# Patient Record
Sex: Female | Born: 1980 | Race: Black or African American | Hispanic: No | Marital: Single | Smoking: Never smoker
Health system: Southern US, Community
[De-identification: ages and names within clinical notes are randomized; demographics above are authoritative.]

## PROBLEM LIST (undated history)

## (undated) DIAGNOSIS — E282 Polycystic ovarian syndrome: Secondary | ICD-10-CM

## (undated) DIAGNOSIS — G4730 Sleep apnea, unspecified: Secondary | ICD-10-CM

## (undated) DIAGNOSIS — J45909 Unspecified asthma, uncomplicated: Secondary | ICD-10-CM

## (undated) HISTORY — PX: WISDOM TOOTH EXTRACTION: SHX21

## (undated) HISTORY — PX: NO PAST SURGERIES: SHX2092

---

## 2004-04-07 ENCOUNTER — Emergency Department: Payer: Self-pay | Admitting: Emergency Medicine

## 2004-05-13 ENCOUNTER — Emergency Department: Payer: Self-pay | Admitting: Emergency Medicine

## 2005-04-28 ENCOUNTER — Emergency Department: Payer: Self-pay | Admitting: General Practice

## 2008-09-28 ENCOUNTER — Emergency Department: Payer: Self-pay | Admitting: Emergency Medicine

## 2009-06-08 ENCOUNTER — Emergency Department: Payer: Self-pay | Admitting: Emergency Medicine

## 2009-12-07 ENCOUNTER — Emergency Department: Payer: Self-pay | Admitting: Emergency Medicine

## 2010-05-22 ENCOUNTER — Emergency Department: Payer: Self-pay | Admitting: Unknown Physician Specialty

## 2010-08-11 ENCOUNTER — Emergency Department: Payer: Self-pay | Admitting: Emergency Medicine

## 2010-12-13 ENCOUNTER — Emergency Department: Payer: Self-pay | Admitting: Emergency Medicine

## 2011-03-23 ENCOUNTER — Emergency Department: Payer: Self-pay | Admitting: Emergency Medicine

## 2011-03-27 ENCOUNTER — Emergency Department: Payer: Self-pay | Admitting: Emergency Medicine

## 2011-06-01 ENCOUNTER — Emergency Department: Payer: Self-pay | Admitting: Emergency Medicine

## 2011-10-20 ENCOUNTER — Emergency Department: Payer: Self-pay | Admitting: *Deleted

## 2011-10-20 LAB — URINALYSIS, COMPLETE
Blood: NEGATIVE
Glucose,UR: NEGATIVE mg/dL (ref 0–75)
Ketone: NEGATIVE
Nitrite: NEGATIVE
Specific Gravity: 1.02 (ref 1.003–1.030)
WBC UR: 12 /HPF (ref 0–5)

## 2011-11-22 ENCOUNTER — Emergency Department: Payer: Self-pay | Admitting: Emergency Medicine

## 2011-11-22 LAB — URINALYSIS, COMPLETE
Bilirubin,UR: NEGATIVE
Blood: NEGATIVE
Glucose,UR: NEGATIVE mg/dL (ref 0–75)
Ketone: NEGATIVE
Ph: 5 (ref 4.5–8.0)
Protein: NEGATIVE
Specific Gravity: 1.033 (ref 1.003–1.030)
Squamous Epithelial: 5
WBC UR: 4 /HPF (ref 0–5)

## 2012-01-05 DIAGNOSIS — L680 Hirsutism: Secondary | ICD-10-CM | POA: Insufficient documentation

## 2012-01-05 DIAGNOSIS — N912 Amenorrhea, unspecified: Secondary | ICD-10-CM | POA: Insufficient documentation

## 2012-01-05 DIAGNOSIS — E282 Polycystic ovarian syndrome: Secondary | ICD-10-CM | POA: Insufficient documentation

## 2012-04-21 ENCOUNTER — Emergency Department: Payer: Self-pay | Admitting: Internal Medicine

## 2012-04-28 ENCOUNTER — Emergency Department: Payer: Self-pay | Admitting: Emergency Medicine

## 2013-01-07 ENCOUNTER — Emergency Department: Payer: Self-pay | Admitting: Emergency Medicine

## 2013-01-07 LAB — URINALYSIS, COMPLETE
Bacteria: NONE SEEN
Bilirubin,UR: NEGATIVE
Blood: NEGATIVE
Glucose,UR: NEGATIVE mg/dL (ref 0–75)
Leukocyte Esterase: NEGATIVE
Nitrite: NEGATIVE
Protein: NEGATIVE
RBC,UR: NONE SEEN /HPF (ref 0–5)
Specific Gravity: 1.019 (ref 1.003–1.030)
WBC UR: 1 /HPF (ref 0–5)

## 2013-06-04 ENCOUNTER — Emergency Department: Payer: Self-pay | Admitting: Internal Medicine

## 2014-03-06 ENCOUNTER — Emergency Department: Payer: Self-pay | Admitting: Emergency Medicine

## 2014-03-06 LAB — URINALYSIS, COMPLETE
Bilirubin,UR: NEGATIVE
Glucose,UR: NEGATIVE mg/dL (ref 0–75)
Ketone: NEGATIVE
Nitrite: NEGATIVE
PH: 7 (ref 4.5–8.0)
PROTEIN: NEGATIVE
RBC,UR: 4 /HPF (ref 0–5)
Specific Gravity: 1.026 (ref 1.003–1.030)

## 2014-03-06 LAB — COMPREHENSIVE METABOLIC PANEL
ALT: 21 U/L
Albumin: 3.3 g/dL — ABNORMAL LOW (ref 3.4–5.0)
Alkaline Phosphatase: 82 U/L
Anion Gap: 4 — ABNORMAL LOW (ref 7–16)
BILIRUBIN TOTAL: 0.3 mg/dL (ref 0.2–1.0)
BUN: 7 mg/dL (ref 7–18)
CALCIUM: 9.1 mg/dL (ref 8.5–10.1)
CO2: 29 mmol/L (ref 21–32)
CREATININE: 0.65 mg/dL (ref 0.60–1.30)
Chloride: 107 mmol/L (ref 98–107)
EGFR (African American): >60
EGFR (Non-African Amer.): >60
Glucose: 98 mg/dL (ref 65–99)
Osmolality: 277 (ref 275–301)
POTASSIUM: 3.5 mmol/L (ref 3.5–5.1)
SGOT(AST): 10 U/L — ABNORMAL LOW (ref 15–37)
SODIUM: 140 mmol/L (ref 136–145)
Total Protein: 7.7 g/dL (ref 6.4–8.2)

## 2014-03-06 LAB — CBC
HCT: 36.8 % (ref 35.0–47.0)
HGB: 11.7 g/dL — ABNORMAL LOW (ref 12.0–16.0)
MCH: 25.7 pg — ABNORMAL LOW (ref 26.0–34.0)
MCHC: 31.7 g/dL — ABNORMAL LOW (ref 32.0–36.0)
MCV: 81 fL (ref 80–100)
Platelet: 333 10*3/uL (ref 150–440)
RBC: 4.55 10*6/uL (ref 3.80–5.20)
RDW: 15.2 % — ABNORMAL HIGH (ref 11.5–14.5)
WBC: 6.7 10*3/uL (ref 3.6–11.0)

## 2014-03-13 ENCOUNTER — Emergency Department: Payer: Self-pay | Admitting: Emergency Medicine

## 2014-03-26 ENCOUNTER — Emergency Department: Payer: Self-pay | Admitting: Emergency Medicine

## 2014-10-29 ENCOUNTER — Other Ambulatory Visit: Payer: Self-pay | Admitting: Obstetrics & Gynecology

## 2014-10-29 DIAGNOSIS — N63 Unspecified lump in unspecified breast: Secondary | ICD-10-CM

## 2014-10-31 ENCOUNTER — Ambulatory Visit
Admission: RE | Admit: 2014-10-31 | Discharge: 2014-10-31 | Disposition: A | Discharge status: Home / Self Care | Payer: No Typology Code available for payment source | Source: Ambulatory Visit | Attending: Obstetrics & Gynecology | Admitting: Obstetrics & Gynecology

## 2014-10-31 ENCOUNTER — Ambulatory Visit: Payer: No Typology Code available for payment source

## 2014-10-31 DIAGNOSIS — L723 Sebaceous cyst: Secondary | ICD-10-CM | POA: Diagnosis not present

## 2014-10-31 DIAGNOSIS — N63 Unspecified lump in unspecified breast: Secondary | ICD-10-CM

## 2014-12-06 ENCOUNTER — Encounter: Payer: Self-pay | Admitting: Emergency Medicine

## 2014-12-06 ENCOUNTER — Emergency Department
Admission: EM | Admit: 2014-12-06 | Discharge: 2014-12-06 | Disposition: A | Discharge status: Home / Self Care | Payer: No Typology Code available for payment source | Attending: Emergency Medicine | Admitting: Emergency Medicine

## 2014-12-06 DIAGNOSIS — Z87891 Personal history of nicotine dependence: Secondary | ICD-10-CM | POA: Diagnosis not present

## 2014-12-06 DIAGNOSIS — J029 Acute pharyngitis, unspecified: Secondary | ICD-10-CM | POA: Diagnosis present

## 2014-12-06 LAB — POCT RAPID STREP A: Streptococcus, Group A Screen (Direct): NEGATIVE

## 2014-12-06 MED ORDER — AMOXICILLIN 875 MG PO TABS: 875.0000 mg | ORAL_TABLET | Freq: Two times a day (BID) | ORAL | Status: DC

## 2014-12-06 MED ORDER — FLUCONAZOLE 150 MG PO TABS: 150.0000 mg | ORAL_TABLET | Freq: Every day | ORAL | Status: DC

## 2014-12-06 MED ORDER — LIDOCAINE VISCOUS 2 % MT SOLN: 20.0000 mL | OROMUCOSAL | Status: DC | PRN

## 2014-12-06 NOTE — ED Notes (Signed)
Patient with complaint of sore throat times 2 days. 

## 2014-12-06 NOTE — Discharge Instructions (Signed)

## 2014-12-06 NOTE — ED Provider Notes (Signed)
[facility] Emergency Department Provider Note  ____________________________________________  Time seen: Approximately 7:10 AM  I have reviewed the triage vital signs and the nursing notes.   HISTORY  Chief Complaint Sore Throat    HPI Kerry Donovan is a 34 y.o. female presents for evaluation of a sore throat 2 days.   No past medical history on file.  There are no active problems to display for this patient.   No past surgical history on file.  Current Outpatient Rx  Name  Route  Sig  Dispense  Refill  . amoxicillin (AMOXIL) 875 MG tablet   Oral   Take 1 tablet (875 mg total) by mouth 2 (two) times daily.   20 tablet   0   . lidocaine (XYLOCAINE) 2 % solution   Mouth/Throat   Use as directed 20 mLs in the mouth or throat as needed for mouth pain.   100 mL   0     Allergies Review of patient's allergies indicates no known allergies.  No family history on file.  Social History History  Substance Use Topics  . Smoking status: Former Games developer  . Smokeless tobacco: Not on file  . Alcohol Use: No    Review of Systems Constitutional: No fever/chills Eyes: No visual changes. ENT: No sore throat. Cardiovascular: Denies chest pain. Respiratory: Denies shortness of breath. Gastrointestinal: No abdominal pain.  No nausea, no vomiting.  No diarrhea.  No constipation. Genitourinary: Negative for dysuria. Musculoskeletal: Negative for back pain. Skin: Negative for rash. Neurological: Negative for headaches, focal weakness or numbness.  10-point ROS otherwise negative.  ____________________________________________   PHYSICAL EXAM:  VITAL SIGNS: ED Triage Vitals  Enc Vitals Group     BP 12/06/14 0517 102/66 mmHg     Pulse Rate 12/06/14 0517 80     Resp 12/06/14 0517 18     Temp 12/06/14 0517 98 F (36.7 C)     Temp Source 12/06/14 0517 Oral     SpO2 12/06/14 0517 93 %     Weight 12/06/14 0517 292 lb 14.4 oz (132.859 kg)    Height 12/06/14 0517  (1.778 m)     Head Cir --      Peak Flow --      Pain Score 12/06/14 0518 5     Pain Loc --      Pain Edu? --      Excl. in GC? --     Constitutional: Alert and oriented. Well appearing and in no acute distress. Eyes: Conjunctivae are normal. PERRL. EOMI. Head: Atraumatic. Nose: No congestion/rhinnorhea. Mouth/Throat: Mucous membranes are moist.  Oropharynx erythematous. Neck: No stridor.  Tender cervical adenopathy noted bilaterally. Cardiovascular: Normal rate, regular rhythm. Grossly normal heart sounds.  Good peripheral circulation. Respiratory: Normal respiratory effort.  No retractions. Lungs CTAB. Musculoskeletal: No lower extremity tenderness nor edema.  No joint effusions. Neurologic:  Normal speech and language. No gross focal neurologic deficits are appreciated. Speech is normal. No gait instability. Skin:  Skin is warm, dry and intact. No rash noted. Psychiatric: Mood and affect are normal. Speech and behavior are normal.  ____________________________________________   LABS (all labs ordered are listed, but only abnormal results are displayed)  Labs Reviewed  POCT RAPID STREP A   ____________________________________________  EKG  Deferred ____________________________________________  RADIOLOGY  Deferred ____________________________________________   PROCEDURES  Procedure(s) performed: None  Critical Care performed: No  ____________________________________________   INITIAL IMPRESSION / ASSESSMENT AND PLAN / ED COURSE  Pertinent labs &  imaging results that were available during my care of the patient were reviewed by me and considered in my medical decision making (see chart for details).  Acute pharyngitis with rapid strep negative. Culture pending. Based on presentation we'll treat patient prophylactically with amoxicillin 875 twice a day. Patient voices understanding and will return to the ER with any worsening  symptomology.  Patient denies any other emergency medical complaints at this time ____________________________________________   FINAL CLINICAL IMPRESSION(S) / ED DIAGNOSES  Final diagnoses:  Acute pharyngitis, unspecified pharyngitis type      Evangeline Dakin, PA-C 12/06/14 3875  Sharman Cheek, MD 12/06/14 1550

## 2015-05-20 IMAGING — CR DG FOOT COMPLETE 3+V*L*
1 series · 3 of 3 positions shown · non-contrast
Comparison: None.

CLINICAL DATA: Acute left foot pain after kicking a door.

EXAM:
LEFT FOOT - COMPLETE 3+ VIEW

[Series 1: x foot ap left · 0.14mm/px · 3 of 3 slices shown]
[im 1/3]
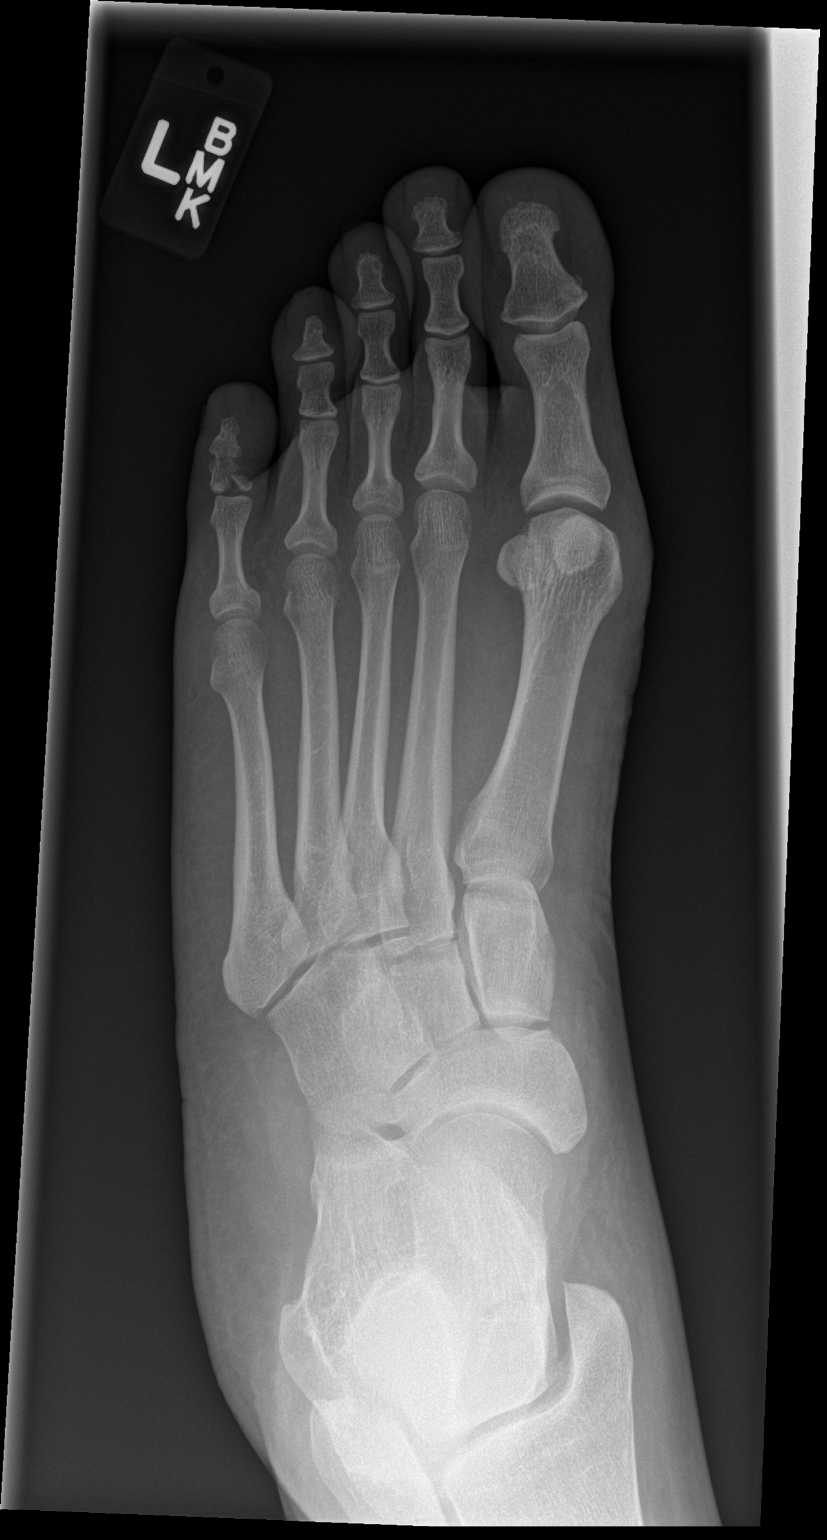
[im 2/3]
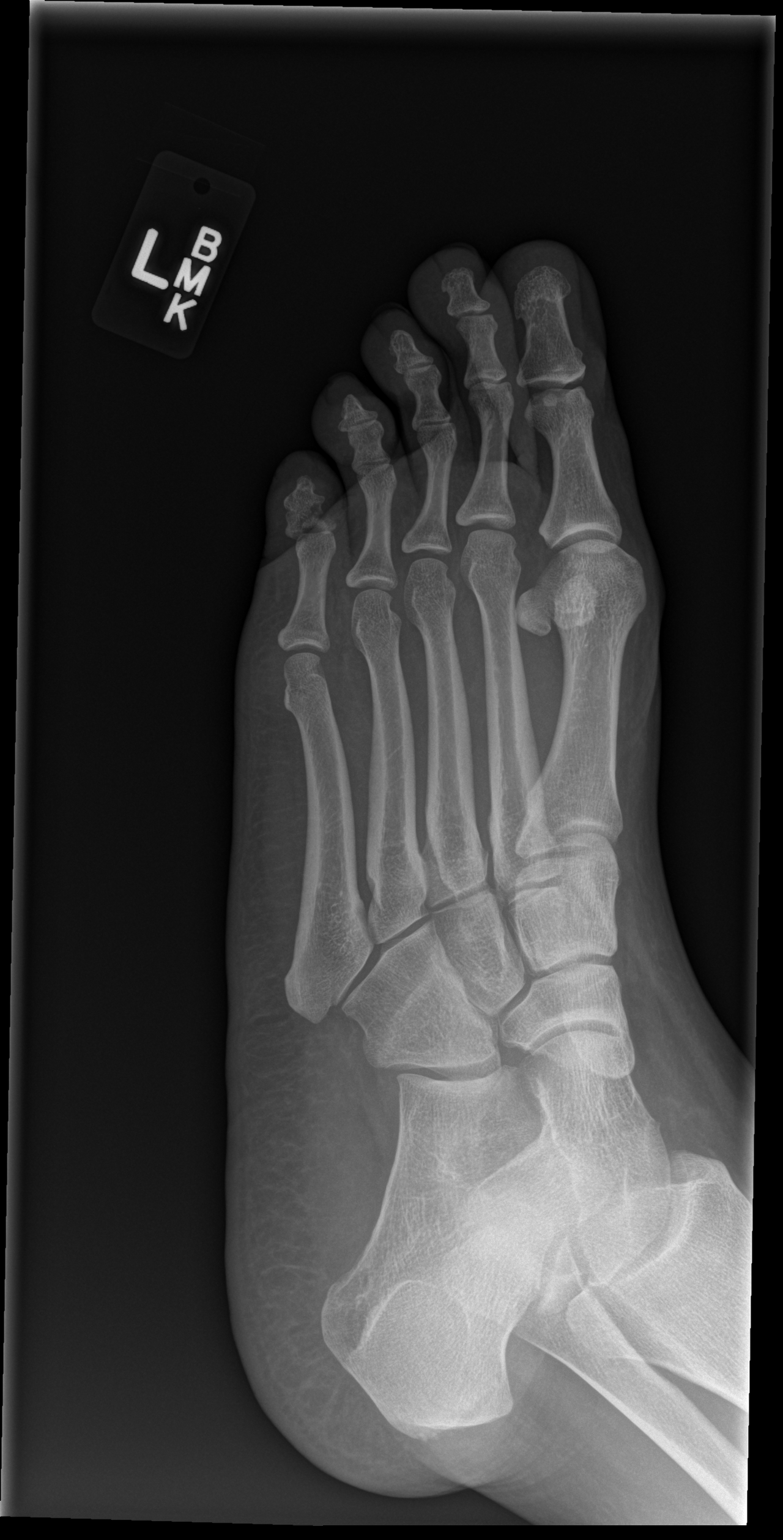
[im 3/3]
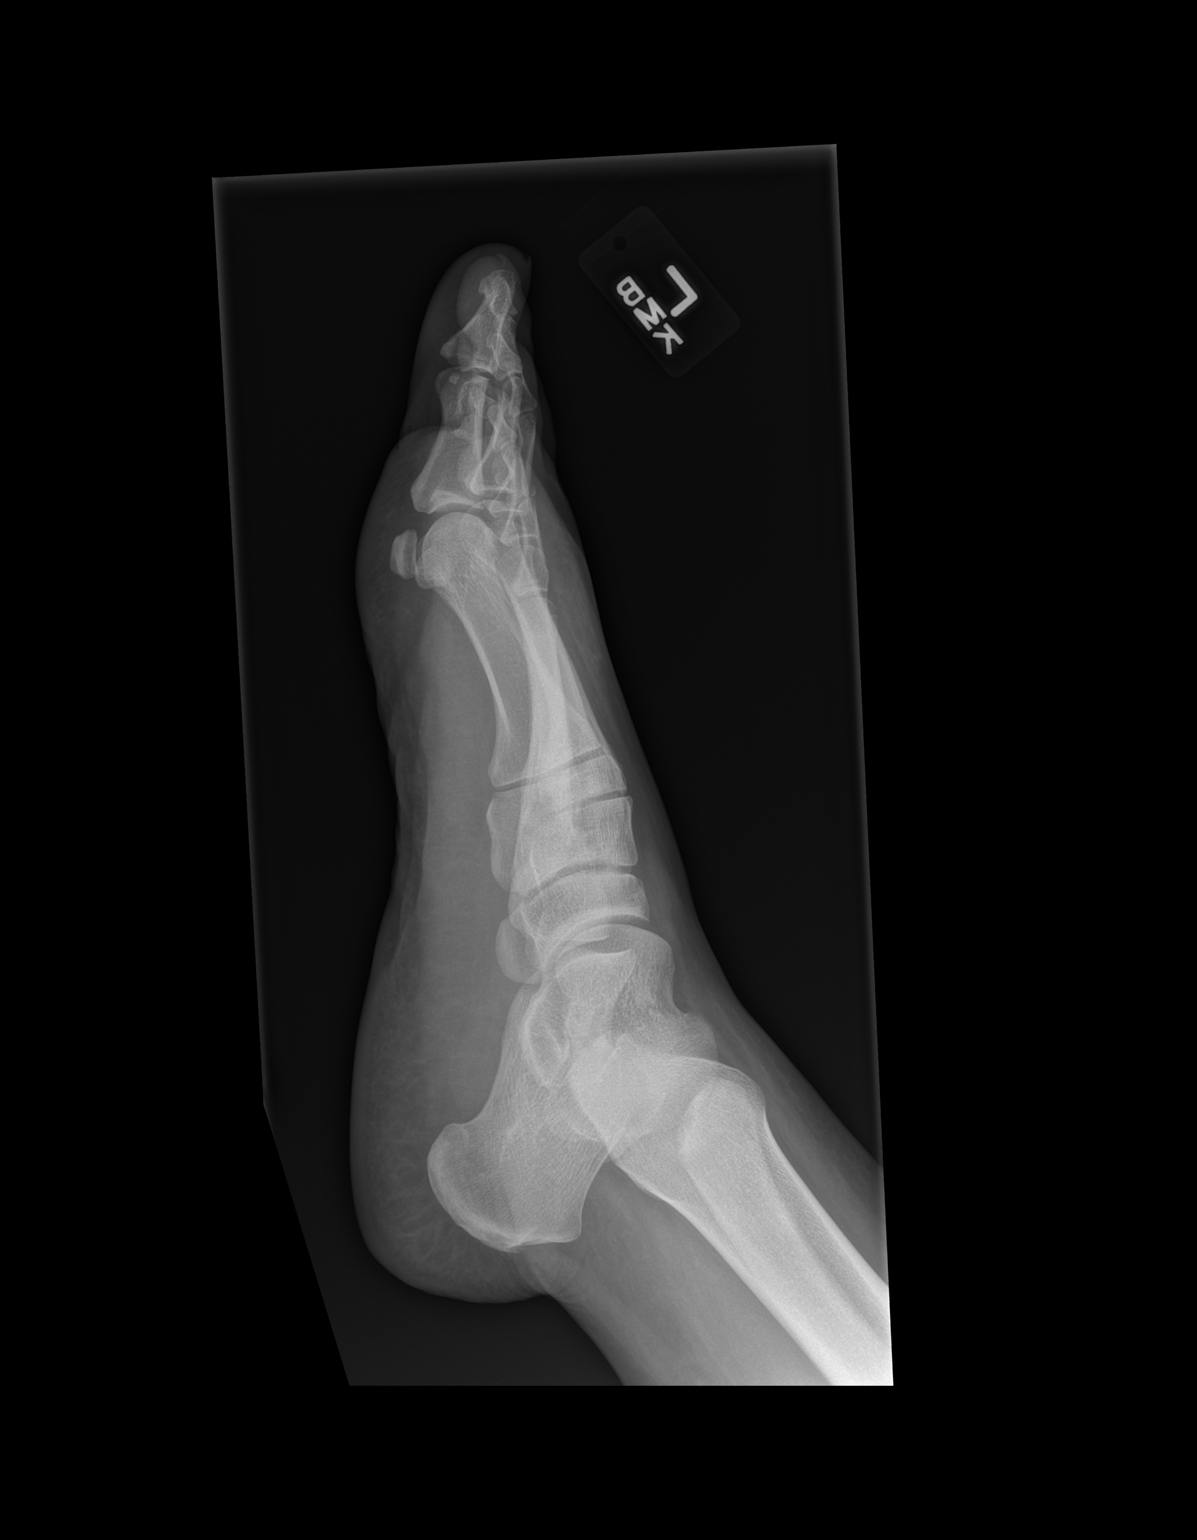

[3 of 3 positions shown; findings below may reference images not displayed]

FINDINGS: There is noted a traumatic, displaced, closed and minimally
comminuted fracture involving the proximal base of the fifth middle
phalanx. No other fracture or dislocation is noted. No soft tissue
abnormality or radiopaque foreign body is noted.
IMPRESSION: Displaced, traumatic, closed and minimally comminuted fracture is
seen involving the proximal base of the fifth middle phalanx. This
is the initial encounter.

## 2015-12-31 ENCOUNTER — Encounter: Payer: Self-pay | Admitting: Emergency Medicine

## 2015-12-31 ENCOUNTER — Emergency Department
Admission: EM | Admit: 2015-12-31 | Discharge: 2015-12-31 | Disposition: A | Discharge status: Home / Self Care | Payer: Medicaid Other | Attending: Emergency Medicine | Admitting: Emergency Medicine

## 2015-12-31 DIAGNOSIS — M545 Low back pain: Secondary | ICD-10-CM | POA: Diagnosis present

## 2015-12-31 DIAGNOSIS — R109 Unspecified abdominal pain: Secondary | ICD-10-CM | POA: Diagnosis not present

## 2015-12-31 DIAGNOSIS — Z87891 Personal history of nicotine dependence: Secondary | ICD-10-CM | POA: Insufficient documentation

## 2015-12-31 DIAGNOSIS — R350 Frequency of micturition: Secondary | ICD-10-CM | POA: Diagnosis not present

## 2015-12-31 LAB — URINALYSIS COMPLETE WITH MICROSCOPIC (ARMC ONLY)
Bacteria, UA: NONE SEEN
Bilirubin Urine: NEGATIVE
Glucose, UA: NEGATIVE mg/dL
Hgb urine dipstick: NEGATIVE
KETONES UR: NEGATIVE mg/dL
Leukocytes, UA: NEGATIVE
Nitrite: NEGATIVE
Protein, ur: NEGATIVE mg/dL
Specific Gravity, Urine: 1.017 (ref 1.005–1.030)
pH: 7 (ref 5.0–8.0)

## 2015-12-31 LAB — POCT PREGNANCY, URINE: PREG TEST UR: NEGATIVE

## 2015-12-31 MED ORDER — NAPROXEN 500 MG PO TABS: 500.0000 mg | ORAL_TABLET | Freq: Two times a day (BID) | ORAL | Status: DC

## 2015-12-31 NOTE — ED Notes (Signed)
Lower back pain for about 1 week with some urinary freq  denies any fever or dysuria  Also states pain is non radaiting

## 2015-12-31 NOTE — ED Provider Notes (Signed)
[facility] Emergency Department Provider Note ____________________________________________  Time seen: Approximately 9:10 AM  I have reviewed the triage vital signs and the nursing notes.   HISTORY  Chief Complaint Back Pain    HPI Kerry PrimasJacquelyn Donovan is a 35 y.o. female who presents to the emergency department for evaluation of lower back pain and urinary frequency for the past week. She denies fever or dysuria. Pain is mainly on the left lower side without radiation. She has not taken any medications for relief of the pain. She reports a history of urinary tract infections with similar symptoms. She states that on Monday she did notice some blood in the urine, but that has not reoccurred since that time. She states that she has to urinate about every 2 hours and the urge is very strong. She denies a history of incontinence.  History reviewed. No pertinent past medical history.  There are no active problems to display for this patient.   History reviewed. No pertinent past surgical history.  Current Outpatient Rx  Name  Route  Sig  Dispense  Refill  . amoxicillin (AMOXIL) 875 MG tablet   Oral   Take 1 tablet (875 mg total) by mouth 2 (two) times daily.   20 tablet   0   . fluconazole (DIFLUCAN) 150 MG tablet   Oral   Take 1 tablet (150 mg total) by mouth daily.   1 tablet   0   . lidocaine (XYLOCAINE) 2 % solution   Mouth/Throat   Use as directed 20 mLs in the mouth or throat as needed for mouth pain.   100 mL   0   . naproxen (NAPROSYN) 500 MG tablet   Oral   Take 1 tablet (500 mg total) by mouth 2 (two) times daily with a meal.   30 tablet   0     Allergies Review of patient's allergies indicates no known allergies.  No family history on file.  Social History Social History  Substance Use Topics  . Smoking status: Former Games developermoker  . Smokeless tobacco: None  . Alcohol Use: No    Review of Systems Constitutional: No recent  illness. Cardiovascular: Denies chest pain or palpitations. Respiratory: Denies shortness of breath. Musculoskeletal: Pain in Left lower back Skin: Negative for rash, wound, lesion. Neurological: Negative for focal weakness or numbness.  ____________________________________________   PHYSICAL EXAM:  VITAL SIGNS: ED Triage Vitals  Enc Vitals Group     BP 12/31/15 0902 121/78 mmHg     Pulse Rate 12/31/15 0902 89     Resp 12/31/15 0902 18     Temp 12/31/15 0902 97.8 F (36.6 C)     Temp Source 12/31/15 0902 Oral     SpO2 12/31/15 0902 100 %     Weight 12/31/15 0902 300 lb (136.079 kg)     Height 12/31/15 0902 5\' 10"  (1.778 m)     Head Cir --      Peak Flow --      Pain Score 12/31/15 0901 5     Pain Loc --      Pain Edu? --      Excl. in GC? --     Constitutional: Alert and oriented. Well appearing and in no acute distress. Eyes: Conjunctivae are normal. EOMI. Head: Atraumatic. Neck: No stridor.  Respiratory: Normal respiratory effort.   Musculoskeletal: Pain in the left lower back is not reproducible with flexion and extension or straight leg raise. There is no CVA tenderness.  Abdomen: Soft, nontender, no rebound or guarding. Neurologic:  Normal speech and language. No gross focal neurologic deficits are appreciated. Speech is normal. No gait instability. Skin:  Skin is warm, dry and intact. Atraumatic. Psychiatric: Mood and affect are normal. Speech and behavior are normal.  ____________________________________________   LABS (all labs ordered are listed, but only abnormal results are displayed)  Labs Reviewed  URINALYSIS COMPLETEWITH MICROSCOPIC ([facility] ONLY) - Abnormal; Notable for the following:    Color, Urine YELLOW (*)    APPearance CLEAR (*)    Squamous Epithelial / LPF 0-5 (*)    All other components within normal limits  URINE CULTURE  POC URINE PREG, ED  POCT PREGNANCY, URINE   ____________________________________________  RADIOLOGY  Not  indicated ____________________________________________   PROCEDURES  Procedure(s) performed: None   ____________________________________________   INITIAL IMPRESSION / ASSESSMENT AND PLAN / ED COURSE  Pertinent labs & imaging results that were available during my care of the patient were reviewed by me and considered in my medical decision making (see chart for details).  Urine culture sent. Patient was encouraged to follow up with her primary care provider for symptoms that are not improving over the next few days. She was also given a referral to see urology. She was encouraged to return to the emergency department for symptoms that change or worsen if some unable to schedule an appointment. She will not be treated with antibiotics today. We'll wait on the urine culture. Patient was agreeable to the plan. ____________________________________________   FINAL CLINICAL IMPRESSION(S) / ED DIAGNOSES  Final diagnoses:  Urinary frequency  Acute right flank pain       Chinita Pester, FNP 12/31/15 1246  Jennye Moccasin, MD 12/31/15 [phone]

## 2015-12-31 NOTE — Discharge Instructions (Signed)
Flank Pain °Flank pain refers to pain that is located on the side of the body between the upper abdomen and the back. The pain may occur over a short period of time (acute) or may be long-term or reoccurring (chronic). It may be mild or severe. Flank pain can be caused by many things. °CAUSES  °Some of the more common causes of flank pain include: °· Muscle strains.   °· Muscle spasms.   °· A disease of your spine (vertebral disk disease).   °· A lung infection (pneumonia).   °· Fluid around your lungs (pulmonary edema).   °· A kidney infection.   °· Kidney stones.   °· A very painful skin rash caused by the chickenpox virus (shingles).   °· Gallbladder disease.   °HOME CARE INSTRUCTIONS  °Home care will depend on the cause of your pain. In general, °· Rest as directed by your caregiver. °· Drink enough fluids to keep your urine clear or pale yellow. °· Only take over-the-counter or prescription medicines as directed by your caregiver. Some medicines may help relieve the pain. °· Tell your caregiver about any changes in your pain. °· Follow up with your caregiver as directed. °SEEK IMMEDIATE MEDICAL CARE IF:  °· Your pain is not controlled with medicine.   °· You have new or worsening symptoms. °· Your pain increases.   °· You have abdominal pain.   °· You have shortness of breath.   °· You have persistent nausea or vomiting.   °· You have swelling in your abdomen.   °· You feel faint or pass out.   °· You have blood in your urine. °· You have a fever or persistent symptoms for more than 2-3 days. °· You have a fever and your symptoms suddenly get worse. °MAKE SURE YOU:  °· Understand these instructions. °· Will watch your condition. °· Will get help right away if you are not doing well or get worse. °  °This information is not intended to replace advice given to you by your health care provider. Make sure you discuss any questions you have with your health care provider. °  °Document Released: 07/21/2005 Document  Revised: 02/22/2012 Document Reviewed: 01/12/2012 °Elsevier Interactive Patient Education ©2016 [employer] ° °

## 2016-01-02 LAB — URINE CULTURE: Special Requests: NORMAL

## 2016-01-07 IMAGING — US US BREAST LTD UNI LEFT INC AXILLA
1 series · 8 of 8 positions shown · non-contrast
Comparison: None.

ACR Breast Density Category a: The breast tissue is almost entirely
fatty.

CLINICAL DATA: 33-year-old with small pea-sized palpable lump in
the lower left breast near the inframammary fold which she states
has been present for approximately 1 year, but recently has become
more prominent. Baseline mammography. Patient denies family history
of breast cancer.

EXAM:
DIGITAL DIAGNOSTIC BILATERAL MAMMOGRAM WITH 3D TOMOSYNTHESIS WITH
CAD
ULTRASOUND LEFT BREAST

[Series 1: us breast ltd uni left inc axilla · 0.08mm/px · 8 of 8 slices shown]
[im 1/8]
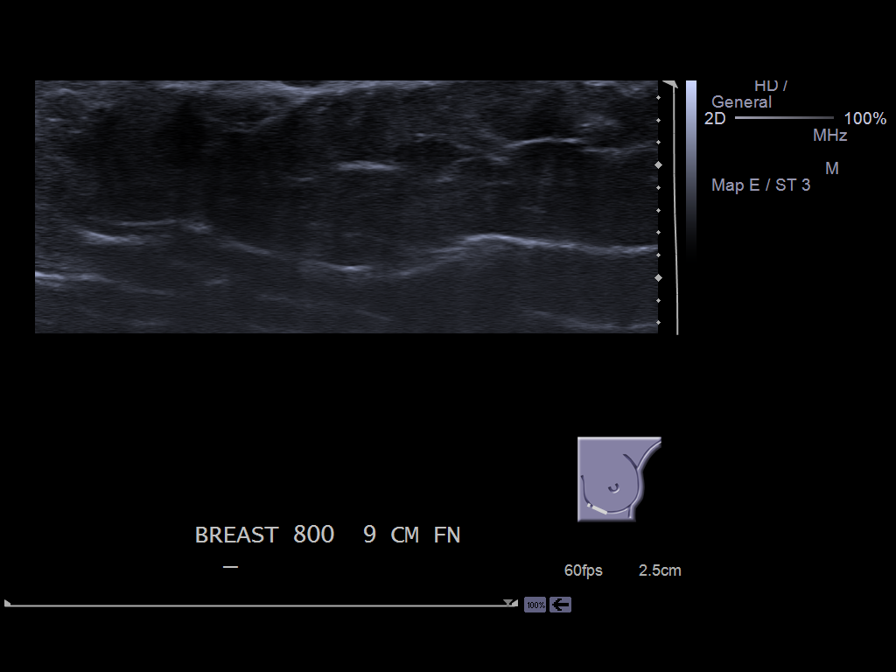
[im 2/8]
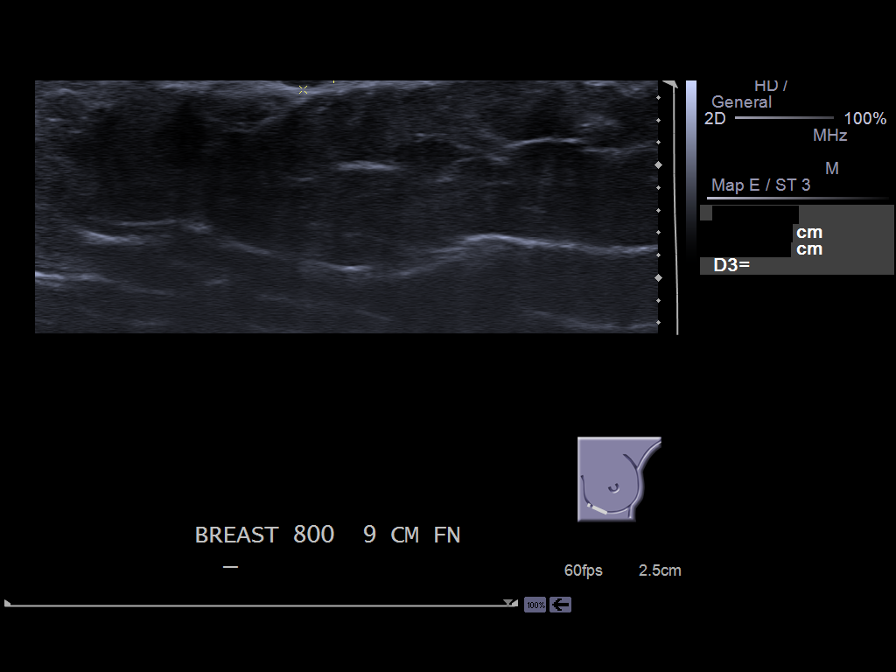
[im 3/8]
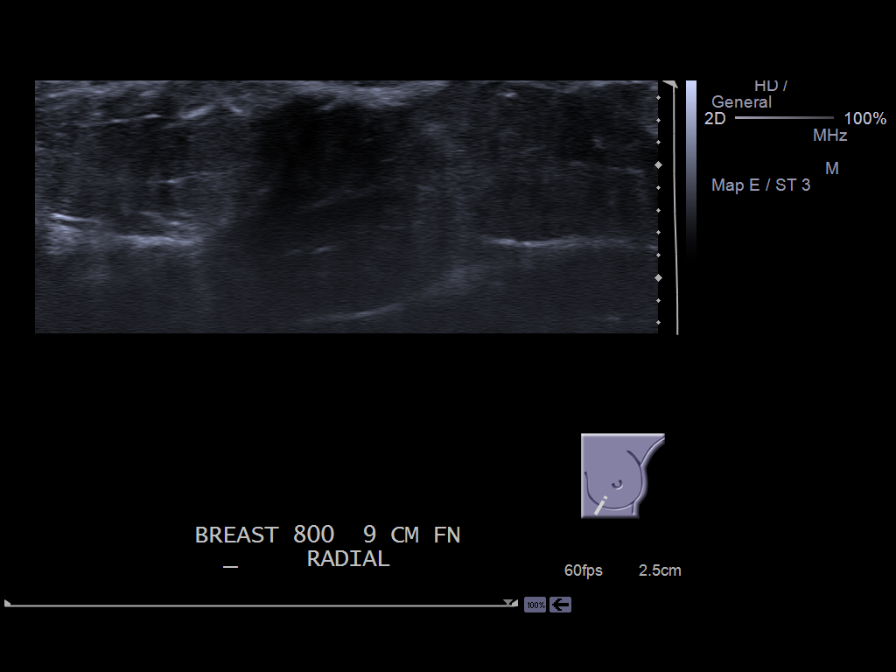
[im 4/8]
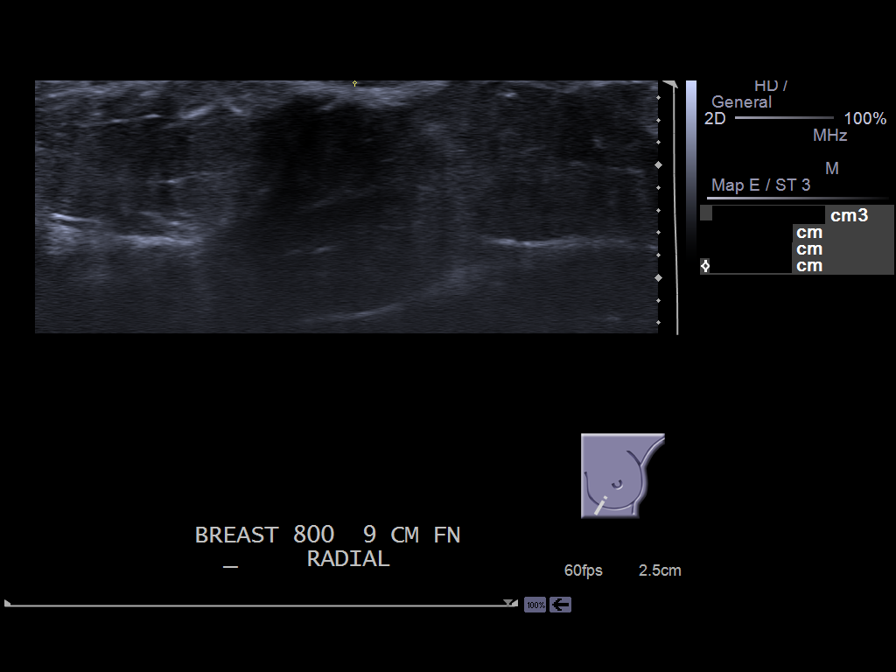
[im 5/8]
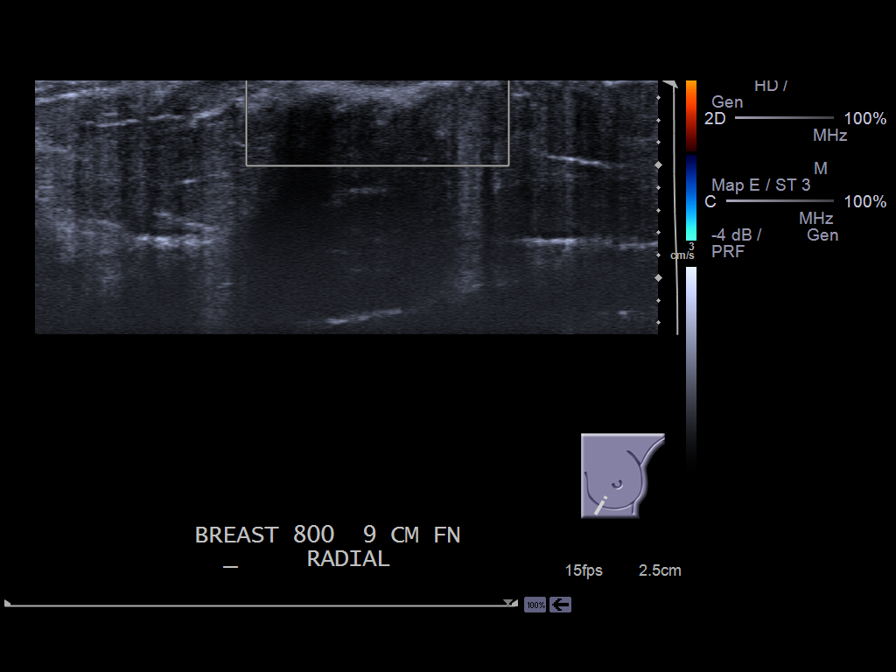
[im 6/8]
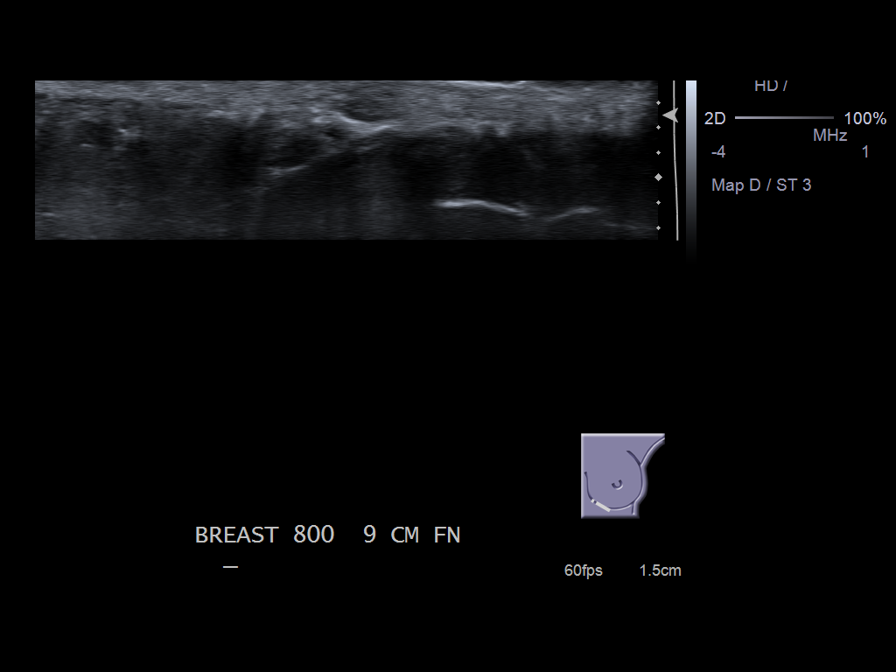
[im 7/8]
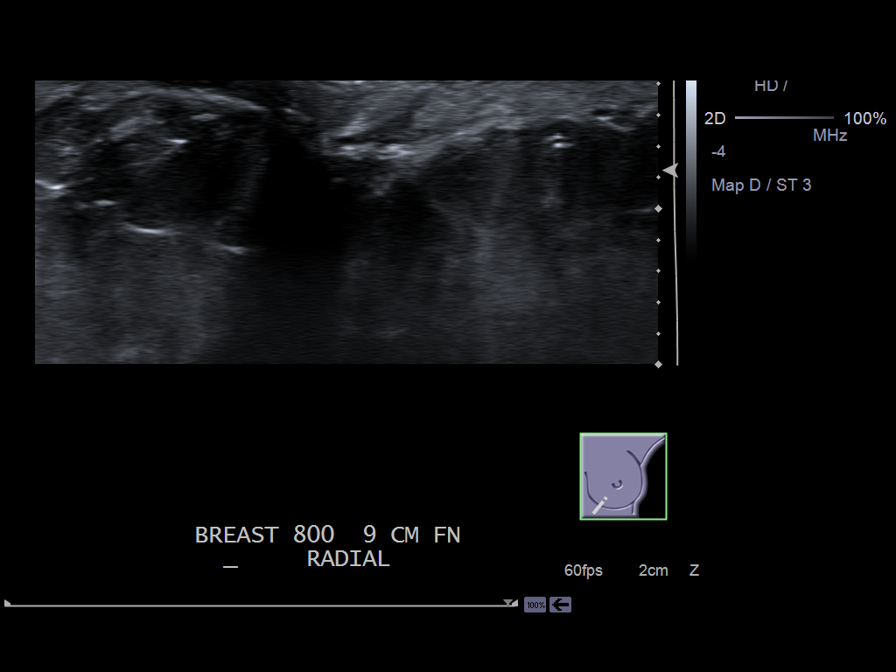
[im 8/8]
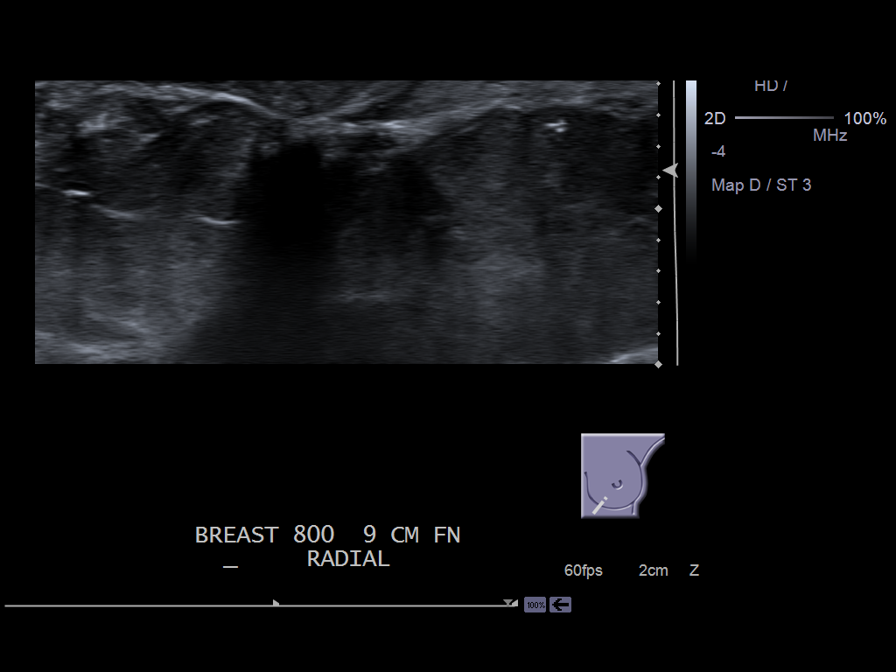

[8 of 8 positions shown; findings below may reference images not displayed]

FINDINGS: CC and MLO views of both breasts and a spot tangential view of the
left breast in the area of palpable concern were obtained, all with
tomosynthesis.

A vague mammographic density on the CC tomosynthesis view in the
vicinity of the area of palpable concern, though this density is
quite superficial. No abnormality is identified directly underlying
the area of palpable concern on the spot tangential view.

No findings suspicious for malignancy in either breast.

Mammographic images were processed with CAD.

On physical exam, there is a palpable pea-sized superficial lump in
the lower inner left breast near the inframammary fold corresponding
to what the patient is feeling.

Targeted left breast ultrasound is performed, showing a hypoechoic
mass with acoustic enhancement within the dermis of the left breast
at the 8 o'clock position approximately 9 cm from the nipple
measuring approximately 5 x 2 x 3 mm, corresponding to the area of
palpable concern. There is a tract from the mass extending to the
skin surface. No cyst, solid mass or abnormal acoustic shadowing is
identified in the breast tissue.
IMPRESSION: 1. Benign sebaceous cyst in the skin of the lower inner left breast
near the inframammary fold at the 8 o'clock position approximately 9
cm from the nipple, accounting for the area of palpable concern.
2. No mammographic or sonographic evidence of malignancy, left
breast.
3. No mammographic evidence of malignancy, right breast.

RECOMMENDATION:
Screening mammogram at age 40 unless there are persistent or
intervening clinical concerns. (Code:H4-1-32B)

The importance of monthly self breast examination and an annual
clinical breast examination was discussed with the patient.

I have discussed the findings and recommendations with the patient.
Results were also provided in writing at the conclusion of the
visit. If applicable, a reminder letter will be sent to the patient
regarding the next appointment.

BI-RADS CATEGORY  2: Benign.

## 2016-03-24 ENCOUNTER — Encounter: Payer: Medicaid Other | Admitting: Obstetrics and Gynecology

## 2016-03-25 ENCOUNTER — Emergency Department
Admission: EM | Admit: 2016-03-25 | Discharge: 2016-03-25 | Disposition: A | Discharge status: Home / Self Care | Payer: Medicaid Other | Attending: Emergency Medicine | Admitting: Emergency Medicine

## 2016-03-25 ENCOUNTER — Encounter: Payer: Self-pay | Admitting: *Deleted

## 2016-03-25 DIAGNOSIS — L02211 Cutaneous abscess of abdominal wall: Secondary | ICD-10-CM | POA: Diagnosis present

## 2016-03-25 DIAGNOSIS — L0291 Cutaneous abscess, unspecified: Secondary | ICD-10-CM

## 2016-03-25 DIAGNOSIS — Z87891 Personal history of nicotine dependence: Secondary | ICD-10-CM | POA: Diagnosis not present

## 2016-03-25 MED ORDER — SULFAMETHOXAZOLE-TRIMETHOPRIM 800-160 MG PO TABS
1.0000 | ORAL_TABLET | Freq: Once | ORAL | Status: AC
  Administered 2016-03-25: 1 via ORAL
  Filled 2016-03-25: qty 1

## 2016-03-25 MED ORDER — SULFAMETHOXAZOLE-TRIMETHOPRIM 800-160 MG PO TABS: 1.0000 | ORAL_TABLET | Freq: Two times a day (BID) | ORAL | 0 refills | Status: DC

## 2016-03-25 MED ORDER — LIDOCAINE-EPINEPHRINE (PF) 1 %-1:200000 IJ SOLN
INTRAMUSCULAR | Status: AC
  Filled 2016-03-25: qty 30

## 2016-03-25 NOTE — Discharge Instructions (Signed)
Return to the ER or primary care office and 2-3 days for wound check and packing removal.

## 2016-03-25 NOTE — ED Notes (Signed)
Pt discharged to home.  Family member driving.  Discharge instructions reviewed.  Verbalized understanding.  No questions or concerns at this time.  Teach back verified.  Pt in NAD.  No items left in ED.   

## 2016-03-25 NOTE — ED Provider Notes (Signed)
[facility]-EMERGENCY DEPARTMENT Provider Note   CSN: [id number] Arrival date & time: 03/25/16  2111     History   Chief Complaint Chief Complaint  Patient presents with  . Abscess    HPI Kerry Donovan is a 35 y.o. female presents to the emergency department for evaluation of abscess to her left flank. Patient has a hard tender area that is becoming more painful over the last 5 days. She denies any fevers chest pain or shortness of breath. No drainage. She has not had a medications for pain. Pain is currently 7 out of 10.  HPI  No past medical history on file.  There are no active problems to display for this patient.   No past surgical history on file.  OB History    No data available       Home Medications    Prior to Admission medications   Medication Sig Start Date End Date Taking? Authorizing Provider  amoxicillin (AMOXIL) 875 MG tablet Take 1 tablet (875 mg total) by mouth 2 (two) times daily. 12/06/14   Charmayne Sheerharles M Beers, PA-C  fluconazole (DIFLUCAN) 150 MG tablet Take 1 tablet (150 mg total) by mouth daily. 12/06/14   Charmayne Sheerharles M Beers, PA-C  lidocaine (XYLOCAINE) 2 % solution Use as directed 20 mLs in the mouth or throat as needed for mouth pain. 12/06/14   Charmayne Sheerharles M Beers, PA-C  naproxen (NAPROSYN) 500 MG tablet Take 1 tablet (500 mg total) by mouth 2 (two) times daily with a meal. 12/31/15   Cari B Triplett, FNP  sulfamethoxazole-trimethoprim (BACTRIM DS,SEPTRA DS) 800-160 MG tablet Take 1 tablet by mouth 2 (two) times daily. 03/25/16   Evon Slackhomas C Gaines, PA-C    Family History No family history on file.  Social History Social History  Substance Use Topics  . Smoking status: Former Games developermoker  . Smokeless tobacco: Never Used  . Alcohol use No     Allergies   Review of patient's allergies indicates no known allergies.   Review of Systems Review of Systems  Constitutional: Negative for chills and fever.  HENT: Negative for ear pain and sore throat.   Eyes:  Negative for pain and visual disturbance.  Respiratory: Negative for cough and shortness of breath.   Cardiovascular: Negative for chest pain and palpitations.  Gastrointestinal: Negative for abdominal pain and vomiting.  Genitourinary: Negative for dysuria and hematuria.  Musculoskeletal: Negative for arthralgias and back pain.  Skin: Positive for wound. Negative for color change and rash.  Neurological: Negative for seizures and syncope.  All other systems reviewed and are negative.    Physical Exam Updated Vital Signs BP (!) 124/91 (BP Location: Left Arm)   Pulse 91   Temp 97.8 F (36.6 C) (Oral)   Resp 16   Ht 5\' 8"  (1.727 m)   Wt 131.5 kg   LMP 02/27/2016 (Approximate)   SpO2 97%   BMI 44.09 kg/m   Physical Exam  Constitutional: She appears well-developed and well-nourished. No distress.  HENT:  Head: Normocephalic and atraumatic.  Eyes: Conjunctivae are normal.  Neck: Neck supple.  Cardiovascular: Normal rate and regular rhythm.   No murmur heard. Pulmonary/Chest: Effort normal and breath sounds normal. No respiratory distress.  Abdominal: Soft. There is no tenderness.  Musculoskeletal:  Examination of the left flank shows a 4 x 4 centimeter area of induration and swelling with mild fluctuance along the left posterior axillary line mid flank that is tender to palpation with no active drainage. There is no centralized  area of necrosis.  Neurological: She is alert.  Skin: Skin is warm and dry.  Psychiatric: She has a normal mood and affect.  Nursing note and vitals reviewed.    ED Treatments / Results  Labs (all labs ordered are listed, but only abnormal results are displayed) Labs Reviewed - No data to display  EKG  EKG Interpretation None       Radiology No results found.  Procedures Procedures (including critical care time) INCISION AND DRAINAGE Performed by: Patience Musca Consent: Verbal consent obtained. Risks and benefits: risks,  benefits and alternatives were discussed Type: abscess  Body area: Left flank mid posterior axillary line  Anesthesia: local infiltration  Incision was made with a scalpel.  Local anesthetic: lidocaine 1 % with epinephrine  Anesthetic total: 3 ml  Complexity: complex Blunt dissection to break up loculations  Drainage: purulent  Drainage amount: Moderate, 5 cc   Packing material: 1/4 in iodoform gauze  Patient tolerance: Patient tolerated the procedure well with no immediate complications.      Medications Ordered in ED Medications  lidocaine-EPINEPHrine (XYLOCAINE-EPINEPHrine) 1 %-1:200000 (PF) injection (not administered)  sulfamethoxazole-trimethoprim (BACTRIM DS,SEPTRA DS) 800-160 MG per tablet 1 tablet (1 tablet Oral Given 03/25/16 2321)     Initial Impression / Assessment and Plan / ED Course  I have reviewed the triage vital signs and the nursing notes.  Pertinent labs & imaging results that were available during my care of the patient were reviewed by me and considered in my medical decision making (see chart for details).  Clinical Course    35 year old female with abscess to the left flank. Incision and drainage performed, iodoform packing inserted into the abscess. Patient will start Bactrim DS twice daily. Ibuprofen as needed for pain. Follow-up in 2 days for packing removal and wound check.  Final Clinical Impressions(s) / ED Diagnoses   Final diagnoses:  Abscess    New Prescriptions Discharge Medication List as of 03/25/2016 11:13 PM    START taking these medications   Details  sulfamethoxazole-trimethoprim (BACTRIM DS,SEPTRA DS) 800-160 MG tablet Take 1 tablet by mouth 2 (two) times daily., Starting Fri 03/25/2016, Print         Evon Slack, PA-C 03/25/16 2345    Jeanmarie Plant, MD 03/25/16 [phone]

## 2016-03-25 NOTE — ED Triage Notes (Signed)
Pt has abscess to left mid back .  Area tender to touch.  No drainage.  Sx for 5 days.

## 2016-05-18 ENCOUNTER — Encounter: Payer: Medicaid Other | Admitting: Obstetrics and Gynecology

## 2016-10-24 DIAGNOSIS — Z6841 Body Mass Index (BMI) 40.0 and over, adult: Secondary | ICD-10-CM

## 2016-11-16 ENCOUNTER — Telehealth: Payer: Self-pay | Admitting: Dietician

## 2016-11-16 ENCOUNTER — Ambulatory Visit: Payer: Medicaid Other | Admitting: Dietician

## 2016-11-16 NOTE — Telephone Encounter (Signed)
Called patient regarding appointment cancellation for today. She would like to reschedule for next Tuesday or Thursday. Left message with available times and requested a call back.

## 2016-11-22 ENCOUNTER — Encounter: Payer: Medicaid Other | Attending: Physician Assistant | Admitting: Dietician

## 2016-11-22 ENCOUNTER — Encounter: Payer: Self-pay | Admitting: Dietician

## 2016-11-22 VITALS — Ht 70.0 in | Wt 320.0 lb

## 2016-11-22 DIAGNOSIS — J4520 Mild intermittent asthma, uncomplicated: Secondary | ICD-10-CM | POA: Insufficient documentation

## 2016-11-22 DIAGNOSIS — Z713 Dietary counseling and surveillance: Secondary | ICD-10-CM | POA: Diagnosis not present

## 2016-11-22 DIAGNOSIS — Z6841 Body Mass Index (BMI) 40.0 and over, adult: Secondary | ICD-10-CM | POA: Insufficient documentation
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
## 2016-11-22 NOTE — Patient Instructions (Signed)
   Start tracking food intake by keeping a journal, and/or try free app like MyFitnessPal, or Lose It.   Increase vegetable portions, and control portions of starches and sometimes meats.   Gradually increase physical exercise.

## 2016-12-20 ENCOUNTER — Ambulatory Visit: Payer: Medicaid Other | Admitting: Dietician

## 2016-12-21 ENCOUNTER — Ambulatory Visit: Payer: Medicaid Other | Admitting: Dietician

## 2016-12-27 ENCOUNTER — Encounter: Payer: Self-pay | Admitting: Dietician

## 2016-12-27 NOTE — Progress Notes (Signed)
Patient missed 2 consecutive appointments, on 2 consecutive days. She has not rescheduled again at this time. Sent discharge letter to MD.

## 2017-01-05 ENCOUNTER — Ambulatory Visit: Payer: Medicaid Other | Attending: Internal Medicine

## 2017-01-10 ENCOUNTER — Ambulatory Visit: Payer: Medicaid Other | Attending: Internal Medicine

## 2017-02-07 ENCOUNTER — Ambulatory Visit: Payer: Medicaid Other | Attending: Internal Medicine

## 2017-02-07 DIAGNOSIS — R0683 Snoring: Secondary | ICD-10-CM | POA: Insufficient documentation

## 2017-02-07 DIAGNOSIS — G4710 Hypersomnia, unspecified: Secondary | ICD-10-CM | POA: Diagnosis present

## 2017-02-07 DIAGNOSIS — G4733 Obstructive sleep apnea (adult) (pediatric): Secondary | ICD-10-CM | POA: Diagnosis not present

## 2017-02-07 DIAGNOSIS — E669 Obesity, unspecified: Secondary | ICD-10-CM | POA: Diagnosis present

## 2017-03-07 ENCOUNTER — Ambulatory Visit: Payer: Medicaid Other | Attending: Neurology

## 2017-03-07 DIAGNOSIS — G4733 Obstructive sleep apnea (adult) (pediatric): Secondary | ICD-10-CM | POA: Insufficient documentation

## 2017-08-21 ENCOUNTER — Telehealth: Payer: Self-pay

## 2017-08-21 NOTE — Telephone Encounter (Signed)
Left message on patient's mothers VM to bring disc with US images, [facility] and we cannot see the images.

## 2017-08-22 ENCOUNTER — Other Ambulatory Visit: Payer: Self-pay

## 2017-08-23 ENCOUNTER — Encounter: Payer: Self-pay | Admitting: Emergency Medicine

## 2017-08-23 ENCOUNTER — Other Ambulatory Visit
Admission: RE | Admit: 2017-08-23 | Discharge: 2017-08-23 | Disposition: A | Discharge status: Home / Self Care | Payer: Medicaid Other | Source: Ambulatory Visit | Attending: Surgery | Admitting: Surgery

## 2017-08-23 ENCOUNTER — Other Ambulatory Visit: Payer: Self-pay

## 2017-08-23 ENCOUNTER — Ambulatory Visit (INDEPENDENT_AMBULATORY_CARE_PROVIDER_SITE_OTHER): Payer: Medicaid Other | Admitting: Surgery

## 2017-08-23 ENCOUNTER — Encounter: Payer: Self-pay | Admitting: Surgery

## 2017-08-23 ENCOUNTER — Emergency Department
Admission: EM | Admit: 2017-08-23 | Discharge: 2017-08-23 | Disposition: A | Discharge status: Home / Self Care | Payer: Medicaid Other | Attending: Emergency Medicine | Admitting: Emergency Medicine

## 2017-08-23 VITALS — BP 129/85 | HR 81 | Temp 97.5°F | Ht 70.0 in | Wt 330.0 lb

## 2017-08-23 DIAGNOSIS — Z87891 Personal history of nicotine dependence: Secondary | ICD-10-CM | POA: Insufficient documentation

## 2017-08-23 DIAGNOSIS — J45909 Unspecified asthma, uncomplicated: Secondary | ICD-10-CM | POA: Diagnosis not present

## 2017-08-23 DIAGNOSIS — W57XXXA Bitten or stung by nonvenomous insect and other nonvenomous arthropods, initial encounter: Secondary | ICD-10-CM | POA: Insufficient documentation

## 2017-08-23 DIAGNOSIS — L539 Erythematous condition, unspecified: Secondary | ICD-10-CM | POA: Diagnosis not present

## 2017-08-23 DIAGNOSIS — R1011 Right upper quadrant pain: Secondary | ICD-10-CM | POA: Diagnosis not present

## 2017-08-23 LAB — CBC WITH DIFFERENTIAL/PLATELET
BASOS ABS: 0 10*3/uL (ref 0–0.1)
Basophils Relative: 0 %
Eosinophils Absolute: 0.2 10*3/uL (ref 0–0.7)
Eosinophils Relative: 4 %
HCT: 35.8 % (ref 35.0–47.0)
Hemoglobin: 11.5 g/dL — ABNORMAL LOW (ref 12.0–16.0)
Lymphocytes Relative: 39 %
Lymphs Abs: 1.6 10*3/uL (ref 1.0–3.6)
MCH: 24.8 pg — AB (ref 26.0–34.0)
MCHC: 32.1 g/dL (ref 32.0–36.0)
MCV: 77.3 fL — ABNORMAL LOW (ref 80.0–100.0)
MONO ABS: 0.4 10*3/uL (ref 0.2–0.9)
Monocytes Relative: 10 %
Neutro Abs: 1.9 10*3/uL (ref 1.4–6.5)
Neutrophils Relative %: 47 %
PLATELETS: 363 10*3/uL (ref 150–440)
RBC: 4.63 MIL/uL (ref 3.80–5.20)
RDW: 16 % — ABNORMAL HIGH (ref 11.5–14.5)
WBC: 4.1 10*3/uL (ref 3.6–11.0)

## 2017-08-23 LAB — COMPREHENSIVE METABOLIC PANEL
ALT: 19 U/L (ref 14–54)
ANION GAP: 9 (ref 5–15)
AST: 20 U/L (ref 15–41)
Albumin: 3.6 g/dL (ref 3.5–5.0)
Alkaline Phosphatase: 75 U/L (ref 38–126)
BUN: 8 mg/dL (ref 6–20)
CHLORIDE: 106 mmol/L (ref 101–111)
CO2: 25 mmol/L (ref 22–32)
Calcium: 9.1 mg/dL (ref 8.9–10.3)
Creatinine, Ser: 0.59 mg/dL (ref 0.44–1.00)
GFR calc non Af Amer: >60 mL/min (ref >60)
Glucose, Bld: 90 mg/dL (ref 65–99)
POTASSIUM: 3.7 mmol/L (ref 3.5–5.1)
Sodium: 140 mmol/L (ref 135–145)
Total Bilirubin: 0.8 mg/dL (ref 0.3–1.2)
Total Protein: 7.9 g/dL (ref 6.5–8.1)

## 2017-08-23 MED ORDER — FLUCONAZOLE 150 MG PO TABS: 150.0000 mg | ORAL_TABLET | Freq: Every day | ORAL | 0 refills | Status: DC

## 2017-08-23 MED ORDER — SULFAMETHOXAZOLE-TRIMETHOPRIM 800-160 MG PO TABS: 1.0000 | ORAL_TABLET | Freq: Two times a day (BID) | ORAL | 0 refills | Status: DC

## 2017-08-23 NOTE — Addendum Note (Signed)
Addended by: Martinez Boxx on: 08/23/2017 12:03 PM   Modules accepted: Orders  

## 2017-08-23 NOTE — ED Triage Notes (Signed)
Pt reports woke up and had small red area with hole to right buttocks. Thinks something bit her during night. NAD. Ambulatory VSS

## 2017-08-23 NOTE — Discharge Instructions (Signed)
And use warm compresses to the area frequently.  Bactrim DS twice daily for 10 days.  [facility] if any continued problems or concerns.  You may also use hydrocortisone cream to the area if any itching.
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
## 2017-08-23 NOTE — Patient Instructions (Addendum)
Please [facility] to [facility] and have your blood work drawn. We will call you with your results.  Please have your ultrasound done and then we will call you results.

## 2017-08-23 NOTE — Addendum Note (Signed)
Addended by: Deloria LairAYLOR, Tavyn Kurka on: 08/23/2017 12:03 PM   Modules accepted: Orders

## 2017-08-23 NOTE — ED Provider Notes (Signed)
[facility] Emergency Department Provider Note  ____________________________________________   First MD Initiated Contact with Patient 08/23/17 1354     (approximate)  I have reviewed the triage vital signs and the nursing notes.   HISTORY  Chief Complaint Insect Bite   HPI Kerry Donovan is a 37 y.o. female is here with complaint of questionable insect bite.  She states that on the right buttocks she noticed a red area which has been itching and a whole in the central area.  She did not see anything bite her but has had problems like this in the past which have become infected.   History reviewed. No pertinent past medical history.  Patient Active Problem List   Diagnosis Date Noted  . Intermittent asthma 11/22/2016  . Morbid obesity with BMI of 45.0-49.9, adult (HCC) 10/24/2016  . Polycystic ovaries 01/05/2012  . Absence of menstruation 01/05/2012  . Excessive hair on females 01/05/2012  . Obesity, Class III, BMI 40-49.9 (morbid obesity) (HCC) 01/05/2012    Past Surgical History:  Procedure Laterality Date  . NO PAST SURGERIES      Prior to Admission medications   Medication Sig Start Date End Date Taking? Authorizing Provider  fluconazole (DIFLUCAN) 150 MG tablet Take 1 tablet (150 mg total) by mouth daily. 08/23/17   Tommi Rumps, PA-C  sulfamethoxazole-trimethoprim (BACTRIM DS,SEPTRA DS) 800-160 MG tablet Take 1 tablet by mouth 2 (two) times daily. 08/23/17   Tommi Rumps, PA-C    Allergies Amoxicillin  Family History  Problem Relation Age of Onset  . Healthy Mother   . Kidney disease Father   . Breast cancer Maternal Aunt   . Prostate cancer Maternal Uncle   . Colon cancer Maternal Uncle     Social History Social History   Tobacco Use  . Smoking status: Former Games developer  . Smokeless tobacco: Never Used  Substance Use Topics  . Alcohol use: No  . Drug use: No    Review of Systems Constitutional: No  fever/chills Cardiovascular: Denies chest pain. Respiratory: Denies shortness of breath. Gastrointestinal: No abdominal pain.  No nausea, no vomiting.  Musculoskeletal: Negative for back pain. Skin: Positive for questionable insect bite. Neurological: Negative for headaches, focal weakness or numbness. ___________________________________________   PHYSICAL EXAM:  VITAL SIGNS: ED Triage Vitals [08/23/17 1223]  Enc Vitals Group     BP 108/64     Pulse Rate 85     Resp 18     Temp 98 F (36.7 C)     Temp Source Oral     SpO2 96 %     Weight (!) 330 lb (149.7 kg)     Height 5\' 10"  (1.778 m)     Head Circumference      Peak Flow      Pain Score 2     Pain Loc      Pain Edu?      Excl. in GC?    Constitutional: Alert and oriented. Well appearing and in no acute distress. Eyes: Conjunctivae are normal.  Head: Atraumatic. Neck: No stridor.   Cardiovascular: Normal rate, regular rhythm. Grossly normal heart sounds.  Good peripheral circulation. Respiratory: Normal respiratory effort.  No retractions. Lungs CTAB. Musculoskeletal: Moves upper and lower extremities without any difficulty.  Normal gait was noted. Neurologic:  Normal speech and language. No gross focal neurologic deficits are appreciated.  Skin:  Skin is warm, dry.  There is a small erythematous area to the right buttocks superiorly without  drainage and without cystic changes.  There is a small puncture to this area. Psychiatric: Mood and affect are normal. Speech and behavior are normal.  ____________________________________________   LABS (all labs ordered are listed, but only abnormal results are displayed)  Labs Reviewed - No data to display  PROCEDURES  Procedure(s) performed: None  Procedures  Critical Care performed: No  ____________________________________________   INITIAL IMPRESSION / ASSESSMENT AND PLAN / ED COURSE  As part of my medical decision making, I reviewed the following data within  the electronic MEDICAL RECORD NUMBER Notes from prior ED visits and [place] Controlled Substance Database  Areas consistent with either insect bite or early cellulitis.  Patient was placed on Bactrim DS twice daily for 10 days.  Tylenol or ibuprofen as needed for pain and she will begin using warm compresses to the area.  She will [facility] if any continued problems.  ____________________________________________   FINAL CLINICAL IMPRESSION(S) / ED DIAGNOSES  Final diagnoses:  Insect bite, initial encounter     ED Discharge Orders        Ordered    sulfamethoxazole-trimethoprim (BACTRIM DS,SEPTRA DS) 800-160 MG tablet  2 times daily     08/23/17 1413    fluconazole (DIFLUCAN) 150 MG tablet  Daily     08/23/17 1436       Note:  This document was prepared using Dragon voice recognition software and may include unintentional dictation errors.    Tommi RumpsSummers, Macy Polio L, PA-C 08/23/17 1638    Nita SickleVeronese, [place], MD 08/24/17 1501

## 2017-08-24 ENCOUNTER — Telehealth: Payer: Self-pay

## 2017-08-24 NOTE — Telephone Encounter (Signed)
Called patient and left her a detailed message letting her know that her labs were normal.   We are waiting now for her U/S results.

## 2017-08-30 ENCOUNTER — Ambulatory Visit
Admission: RE | Admit: 2017-08-30 | Discharge: 2017-08-30 | Disposition: A | Discharge status: Home / Self Care | Payer: Medicaid Other | Source: Ambulatory Visit | Attending: Surgery | Admitting: Surgery

## 2017-08-30 DIAGNOSIS — R1011 Right upper quadrant pain: Secondary | ICD-10-CM | POA: Diagnosis not present

## 2017-09-13 ENCOUNTER — Encounter: Payer: Self-pay | Admitting: Surgery

## 2017-09-13 ENCOUNTER — Ambulatory Visit (INDEPENDENT_AMBULATORY_CARE_PROVIDER_SITE_OTHER): Payer: Medicaid Other | Admitting: Surgery

## 2017-09-13 VITALS — BP 105/75 | HR 130 | Temp 98.1°F | Ht 70.0 in | Wt 335.5 lb

## 2017-09-13 DIAGNOSIS — R1011 Right upper quadrant pain: Secondary | ICD-10-CM

## 2017-09-13 NOTE — Patient Instructions (Signed)
We will send the referral to [facility]. Someone from their office will contact you to schedule an appointment. If you have not heard from anyone within 5-7 days please call our office and we can check on this for you.  Please call our office if you have questions or concerns.
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
## 2017-09-15 ENCOUNTER — Telehealth: Payer: Self-pay | Admitting: Surgery

## 2017-09-15 NOTE — Telephone Encounter (Signed)
Patient called said she just spoke with  Gi and they still haven't heard anything about the referral and is asking if someone would give her a call or an update. Please call patient and advise.

## 2017-09-18 NOTE — Telephone Encounter (Signed)
Patient came in on 09/13/2017 to go over her images results with Dr. Excell Seltzerooper.

## 2017-09-19 NOTE — Telephone Encounter (Signed)
Patient has been advised that [facility] has the referral and she should be expecting a phone call for an appointment. I have given her the telephone number to the clinic. [phone].

## 2017-09-21 ENCOUNTER — Other Ambulatory Visit: Payer: Self-pay | Admitting: Gastroenterology

## 2017-09-21 DIAGNOSIS — R1011 Right upper quadrant pain: Secondary | ICD-10-CM | POA: Diagnosis not present

## 2017-09-21 DIAGNOSIS — R5382 Chronic fatigue, unspecified: Secondary | ICD-10-CM | POA: Diagnosis not present

## 2017-09-21 DIAGNOSIS — D509 Iron deficiency anemia, unspecified: Secondary | ICD-10-CM | POA: Diagnosis not present

## 2017-09-21 DIAGNOSIS — R1031 Right lower quadrant pain: Secondary | ICD-10-CM | POA: Diagnosis not present

## 2017-09-28 ENCOUNTER — Ambulatory Visit: Admission: RE | Admit: 2017-09-28 | Payer: Medicaid Other | Source: Ambulatory Visit

## 2017-11-21 ENCOUNTER — Encounter: Payer: Self-pay | Admitting: Student

## 2017-11-22 ENCOUNTER — Ambulatory Visit: Payer: Medicaid Other | Admitting: Certified Registered Nurse Anesthetist

## 2017-11-22 ENCOUNTER — Encounter: Payer: Self-pay | Admitting: Certified Registered Nurse Anesthetist

## 2017-11-22 ENCOUNTER — Other Ambulatory Visit: Payer: Self-pay

## 2017-11-22 ENCOUNTER — Ambulatory Visit
Admission: RE | Admit: 2017-11-22 | Discharge: 2017-11-22 | Disposition: A | Discharge status: Home / Self Care | Payer: Medicaid Other | Source: Ambulatory Visit | Attending: Internal Medicine | Admitting: Internal Medicine

## 2017-11-22 ENCOUNTER — Encounter
Admission: RE | Disposition: A | Discharge status: Home / Self Care | Payer: Self-pay | Source: Ambulatory Visit | Attending: Internal Medicine

## 2017-11-22 DIAGNOSIS — R5382 Chronic fatigue, unspecified: Secondary | ICD-10-CM | POA: Diagnosis not present

## 2017-11-22 DIAGNOSIS — R1011 Right upper quadrant pain: Secondary | ICD-10-CM | POA: Insufficient documentation

## 2017-11-22 DIAGNOSIS — Z79899 Other long term (current) drug therapy: Secondary | ICD-10-CM | POA: Diagnosis not present

## 2017-11-22 DIAGNOSIS — D509 Iron deficiency anemia, unspecified: Secondary | ICD-10-CM | POA: Diagnosis present

## 2017-11-22 DIAGNOSIS — K319 Disease of stomach and duodenum, unspecified: Secondary | ICD-10-CM | POA: Insufficient documentation

## 2017-11-22 DIAGNOSIS — Z880 Allergy status to penicillin: Secondary | ICD-10-CM | POA: Diagnosis not present

## 2017-11-22 DIAGNOSIS — Z793 Long term (current) use of hormonal contraceptives: Secondary | ICD-10-CM | POA: Insufficient documentation

## 2017-11-22 HISTORY — PX: ESOPHAGOGASTRODUODENOSCOPY (EGD) WITH PROPOFOL: SHX5813

## 2017-11-22 HISTORY — DX: Unspecified asthma, uncomplicated: J45.909

## 2017-11-22 HISTORY — DX: Polycystic ovarian syndrome: E28.2

## 2017-11-22 HISTORY — DX: Sleep apnea, unspecified: G47.30

## 2017-11-22 HISTORY — PX: COLONOSCOPY WITH PROPOFOL: SHX5780

## 2017-11-22 LAB — POCT PREGNANCY, URINE: Preg Test, Ur: NEGATIVE

## 2017-11-22 SURGERY — COLONOSCOPY WITH PROPOFOL
Anesthesia: General

## 2017-11-22 MED ORDER — FENTANYL CITRATE (PF) 100 MCG/2ML IJ SOLN
INTRAMUSCULAR | Status: DC | PRN
  Administered 2017-11-22: 50 ug via INTRAVENOUS

## 2017-11-22 MED ORDER — PROPOFOL 500 MG/50ML IV EMUL
INTRAVENOUS | Status: DC | PRN
  Administered 2017-11-22: 140 ug/kg/min via INTRAVENOUS

## 2017-11-22 MED ORDER — SODIUM CHLORIDE 0.9 % IV SOLN
INTRAVENOUS | Status: DC
  Administered 2017-11-22: 09:00:00 via INTRAVENOUS

## 2017-11-22 MED ORDER — MIDAZOLAM HCL 2 MG/2ML IJ SOLN
INTRAMUSCULAR | Status: DC | PRN
  Administered 2017-11-22: 2 mg via INTRAVENOUS

## 2017-11-22 MED ORDER — PROPOFOL 10 MG/ML IV BOLUS
INTRAVENOUS | Status: DC | PRN
  Administered 2017-11-22: 23 mg via INTRAVENOUS
  Administered 2017-11-22: 60 mg via INTRAVENOUS

## 2017-11-22 MED ORDER — LIDOCAINE HCL (CARDIAC) PF 100 MG/5ML IV SOSY
PREFILLED_SYRINGE | INTRAVENOUS | Status: DC | PRN
  Administered 2017-11-22: 50 mg via INTRAVENOUS

## 2017-11-22 MED ORDER — PROPOFOL 500 MG/50ML IV EMUL
INTRAVENOUS | Status: AC
  Filled 2017-11-22: qty 50

## 2017-11-22 MED ORDER — MIDAZOLAM HCL 2 MG/2ML IJ SOLN
INTRAMUSCULAR | Status: AC
  Filled 2017-11-22: qty 2

## 2017-11-22 MED ORDER — LIDOCAINE HCL (PF) 2 % IJ SOLN
INTRAMUSCULAR | Status: AC
  Filled 2017-11-22: qty 10

## 2017-11-22 MED ORDER — FENTANYL CITRATE (PF) 100 MCG/2ML IJ SOLN
INTRAMUSCULAR | Status: AC
  Filled 2017-11-22: qty 2

## 2017-11-22 MED ORDER — PHENYLEPHRINE HCL 10 MG/ML IJ SOLN
INTRAMUSCULAR | Status: DC | PRN
  Administered 2017-11-22: 200 ug via INTRAVENOUS

## 2017-11-22 NOTE — Transfer of Care (Signed)
Immediate Anesthesia Transfer of Care Note  Patient: Kerry Donovan  Procedure(s) Performed: COLONOSCOPY WITH PROPOFOL (N/A ) ESOPHAGOGASTRODUODENOSCOPY (EGD) WITH PROPOFOL (N/A )  Patient Location: Endoscopy Unit  Anesthesia Type:General  Level of Consciousness: drowsy  Airway & Oxygen Therapy: Patient Spontanous Breathing and Patient connected to nasal cannula oxygen  Post-op Assessment: Report given to RN and Post -op Vital signs reviewed and stable  Post vital signs: Reviewed and stable  Last Vitals:  Vitals Value Taken Time  BP 80/55 11/22/2017 10:10 AM  Temp    Pulse 113 11/22/2017 10:11 AM  Resp 28 11/22/2017 10:11 AM  SpO2 97 % 11/22/2017 10:11 AM  Vitals shown include unvalidated device data.  Last Pain:  Vitals:   11/22/17 1010  TempSrc: (P) Tympanic  PainSc:          Complications: No apparent anesthesia complications

## 2017-11-22 NOTE — H&P (Signed)
Outpatient short stay form Pre-procedure 11/22/2017 9:33 AM Kerry Donovan, M.D.  Primary Physician: Maurine MinisterMiriam McLaughlin, P.A.  Reason for visit:  Iron deficiency anemia, fatigue, suspect GI source  History of present illness:  Patient with chronic RUQ pain, NSAID use revealed borderline iron deficiency anemia. No change in bowel habits, dysphagia, weight loss, overt rectal bleeding.     Current Facility-Administered Medications:  .  0.9 %  sodium chloride infusion, , Intravenous, Continuous, [place], Boykin Nearingeodoro K, MD, Last Rate: 20 mL/hr at 11/22/17 [id number]  Medications Prior to Admission  Medication Sig Dispense Refill Last Dose  . dicyclomine (BENTYL) 10 MG capsule Take 10 mg by mouth 4 (four) times daily -  before meals and at bedtime.   Not Taking at Unknown time  . fluconazole (DIFLUCAN) 150 MG tablet Take 1 tablet (150 mg total) by mouth daily. (Patient not taking: Reported on 11/22/2017) 1 tablet 0 Not Taking at Unknown time  . medroxyPROGESTERone (PROVERA) 10 MG tablet   0 Not Taking at Unknown time  . Multiple Vitamin (MULTIVITAMIN) tablet Take 1 tablet by mouth daily.   Not Taking at Unknown time  . sulfamethoxazole-trimethoprim (BACTRIM DS,SEPTRA DS) 800-160 MG tablet Take 1 tablet by mouth 2 (two) times daily. (Patient not taking: Reported on 11/22/2017) 20 tablet 0 Not Taking at Unknown time     Allergies  Allergen Reactions  . Amoxicillin Hives     Past Medical History:  Diagnosis Date  . Asthma   . PCOS (polycystic ovarian syndrome)   . Sleep apnea     Review of systems:  Otherwise negative.    Physical Exam  Gen: Alert, oriented. Appears stated age.  HEENT: [place]/AT. PERRLA. Lungs: CTA, no wheezes. CV: RR nl S1, S2. Abd: soft, benign, no masses. BS+ Ext: No edema. Pulses 2+    Planned procedures: Proceed with colonoscopy. The patient understands the nature of the planned procedure, indications, risks, alternatives and potential complications including but not  limited to bleeding, infection, perforation, damage to internal organs and possible oversedation/side effects from anesthesia. The patient agrees and gives consent to proceed.  Please refer to procedure notes for findings, recommendations and patient disposition/instructions.     [name], M.D. Gastroenterology 11/22/2017  9:33 AM

## 2017-11-22 NOTE — Op Note (Signed)
[facility] Patient Name: Kerry Donovan Procedure Date: 11/22/2017 9:36 AM MRN: [id number] Account #: [id number] Date of Birth: 1981/05/19 Admit Type: Outpatient Age: 37 Room: [facility] ENDO ROOM 4 Gender: Female Note Status: Finalized Procedure:            Colonoscopy Indications:          Unexplained iron deficiency anemia Providers:            Boykin Nearing. Toledo MD, MD Medicines:            Propofol per Anesthesia Complications:        No immediate complications. Procedure:            Pre-Anesthesia Assessment:                       - The risks and benefits of the procedure and the                        sedation options and risks were discussed with the                        patient. All questions were answered and informed                        consent was obtained.                       - Patient identification and proposed procedure were                        verified prior to the procedure by the nurse. The                        procedure was verified in the procedure room.                       - ASA Grade Assessment: II - A patient with mild                        systemic disease.                       - After reviewing the risks and benefits, the patient                        was deemed in satisfactory condition to undergo the                        procedure.                       After obtaining informed consent, the colonoscope was                        passed under direct vision. Throughout the procedure,                        the patient's blood pressure, pulse, and oxygen                        saturations were monitored continuously. The  Colonoscope was introduced through the anus and                        advanced to the the cecum, identified by appendiceal                        orifice and ileocecal valve. The colonoscopy was                        performed without difficulty. The patient  tolerated the                        procedure well. The quality of the bowel preparation                        was excellent. The ileocecal valve, appendiceal                        orifice, and rectum were photographed. Findings:      The perianal and digital rectal examinations were normal. Pertinent       negatives include normal sphincter tone and no palpable rectal lesions.      The colon (entire examined portion) appeared normal.      The exam was otherwise without abnormality on direct and retroflexion       views. Impression:           - The entire examined colon is normal.                       - The examination was otherwise normal on direct and                        retroflexion views.                       - No specimens collected. Recommendation:       - Patient has a contact number available for                        emergencies. The signs and symptoms of potential                        delayed complications were discussed with the patient.                        Return to normal activities tomorrow. Written discharge                        instructions were provided to the patient.                       - Resume previous diet.                       - Continue present medications.                       - Repeat colonoscopy at age 37 for screening purposes.                       - Return to GI office PRN. Procedure Code(s):    ---  Professional ---                       [phone], Colonoscopy, flexible; diagnostic, including                        collection of specimen(s) by brushing or washing, when                        performed (separate procedure) Diagnosis Code(s):    --- Professional ---                       D50.9, Iron deficiency anemia, unspecified CPT copyright 2017 American Medical Association. All rights reserved. The codes documented in this report are preliminary and upon coder review may  be revised to meet current compliance requirements. Stanton Kidney MD, MD 11/22/2017 10:07:04 AM This report has been signed electronically. Number of Addenda: 0 Note Initiated On: 11/22/2017 9:36 AM Scope Withdrawal Time: 0 hours 7 minutes 8 seconds  Total Procedure Duration: 0 hours 9 minutes 59 seconds       [facility]

## 2017-11-22 NOTE — Interval H&P Note (Signed)
History and Physical Interval Note:  11/22/2017 9:35 AM  Kerry Donovan  has presented today for surgery, with the diagnosis of IDA CHRONIC FATIGUE  The various methods of treatment have been discussed with the patient and family. After consideration of risks, benefits and other options for treatment, the patient has consented to  Procedure(s): COLONOSCOPY WITH PROPOFOL (N/A) ESOPHAGOGASTRODUODENOSCOPY (EGD) WITH PROPOFOL (N/A) as a surgical intervention .  The patient's history has been reviewed, patient examined, no change in status, stable for surgery.  I have reviewed the patient's chart and labs.  Questions were answered to the patient's satisfaction.     [place], [place]

## 2017-11-22 NOTE — Op Note (Signed)
[facility] Patient Name: Kerry PrimasJacquelyn Rylander Procedure Date: 11/22/2017 9:37 AM MRN: [id number] Account #: [id number] Date of Birth: Jul 06, 1980 Admit Type: Outpatient Age: [age] Room: [facility] ENDO ROOM 4 Gender: Female Note Status: Finalized Procedure:            Upper GI endoscopy Indications:          Suspected upper gastrointestinal bleeding in patient                        with unexplained iron deficiency anemia, Abdominal pain                        in the right upper quadrant Providers:            Boykin Nearingeodoro K. Toledo MD, MD Medicines:            Propofol per Anesthesia Complications:        No immediate complications. Procedure:            Pre-Anesthesia Assessment:                       - The risks and benefits of the procedure and the                        sedation options and risks were discussed with the                        patient. All questions were answered and informed                        consent was obtained.                       - Patient identification and proposed procedure were                        verified prior to the procedure by the nurse. The                        procedure was verified in the procedure room.                       - ASA Grade Assessment: II - A patient with mild                        systemic disease.                       - After reviewing the risks and benefits, the patient                        was deemed in satisfactory condition to undergo the                        procedure.                       After obtaining informed consent, the endoscope was                        passed under direct vision. Throughout the procedure,  the patient's blood pressure, pulse, and oxygen                        saturations were monitored continuously. The Endoscope                        was introduced through the mouth, and advanced to the                        third part of duodenum. The  upper GI endoscopy was                        accomplished without difficulty. The patient tolerated                        the procedure well. Findings:      The examined esophagus was normal.      Scattered moderate inflammation characterized by erosions and erythema       was found in the gastric antrum. Biopsies were taken with a cold forceps       for Helicobacter pylori testing.      The cardia and gastric fundus were normal on retroflexion.      The examined duodenum was normal. Impression:           - Normal esophagus.                       - Gastritis. Biopsied.                       - Normal examined duodenum. Recommendation:       - Await pathology results.                       - Proceed with colonoscopy Procedure Code(s):    --- Professional ---                       [phone], Esophagogastroduodenoscopy, flexible, transoral;                        with biopsy, single or multiple Diagnosis Code(s):    --- Professional ---                       R10.11, Right upper quadrant pain                       D50.9, Iron deficiency anemia, unspecified                       K29.70, Gastritis, unspecified, without bleeding CPT copyright 2017 American Medical Association. All rights reserved. The codes documented in this report are preliminary and upon coder review may  be revised to meet current compliance requirements. Stanton Kidney MD, MD 11/22/2017 9:53:08 AM This report has been signed electronically. Number of Addenda: 0 Note Initiated On: 11/22/2017 9:37 AM      [facility]

## 2017-11-22 NOTE — Anesthesia Post-op Follow-up Note (Signed)
Anesthesia QCDR form completed.        

## 2017-11-22 NOTE — Anesthesia Preprocedure Evaluation (Addendum)
Anesthesia Evaluation  Patient identified by MRN, date of birth, ID band Patient awake    Reviewed: Allergy & Precautions, H&P , NPO status , Patient's Chart, lab work & pertinent test results, reviewed documented beta blocker date and time   History of Anesthesia Complications Negative for: history of anesthetic complications  Airway Mallampati: II  TM Distance: >3 FB Neck ROM: full    Dental  (+) Dental Advidsory Given, Teeth Intact   Pulmonary neg shortness of breath, asthma , sleep apnea , neg COPD, neg recent URI,           Cardiovascular Exercise Tolerance: Good negative cardio ROS       Neuro/Psych negative neurological ROS  negative psych ROS   GI/Hepatic negative GI ROS, Neg liver ROS,   Endo/Other  neg diabetesMorbid obesity  Renal/GU negative Renal ROS  negative genitourinary   Musculoskeletal   Abdominal   Peds  Hematology negative hematology ROS (+)   Anesthesia Other Findings Past Medical History: No date: Asthma No date: PCOS (polycystic ovarian syndrome) No date: Sleep apnea   Reproductive/Obstetrics negative OB ROS                            Anesthesia Physical Anesthesia Plan  ASA: III  Anesthesia Plan: General   Post-op Pain Management:    Induction: Intravenous  PONV Risk Score and Plan: 3 and Propofol infusion  Airway Management Planned: Nasal Cannula  Additional Equipment:   Intra-op Plan:   Post-operative Plan:   Informed Consent: I have reviewed the patients History and Physical, chart, labs and discussed the procedure including the risks, benefits and alternatives for the proposed anesthesia with the patient or authorized representative who has indicated his/her understanding and acceptance.   Dental Advisory Given  Plan Discussed with: Anesthesiologist, CRNA and Surgeon  Anesthesia Plan Comments:        Anesthesia Quick Evaluation

## 2017-11-23 NOTE — Anesthesia Postprocedure Evaluation (Signed)
Anesthesia Post Note  Patient: Kia Villard  Procedure(s) Performed: COLONOSCOPY WITH PROPOFOL (N/A ) ESOPHAGOGASTRODUODENOSCOPY (EGD) WITH PROPOFOL (N/A )  Patient location during evaluation: Endoscopy Anesthesia Type: General Level of consciousness: awake and alert Pain management: pain level controlled Vital Signs Assessment: post-procedure vital signs reviewed and stable Respiratory status: spontaneous breathing, nonlabored ventilation, respiratory function stable and patient connected to nasal cannula oxygen Cardiovascular status: blood pressure returned to baseline and stable Postop Assessment: no apparent nausea or vomiting Anesthetic complications: no     Last Vitals:  Vitals:   11/22/17 1015 11/22/17 1104  BP: 95/62 (!) 126/96  Pulse:    Resp:    Temp:    SpO2:      Last Pain:  Vitals:   11/23/17 0811  TempSrc:   PainSc: 0-No pain                 Lenard SimmerAndrew Kaylianna Detert

## 2017-11-24 LAB — SURGICAL PATHOLOGY

## 2017-11-28 ENCOUNTER — Encounter: Payer: Self-pay | Admitting: Internal Medicine

## 2018-01-01 ENCOUNTER — Other Ambulatory Visit: Payer: Self-pay | Admitting: Obstetrics & Gynecology

## 2018-01-01 DIAGNOSIS — N6320 Unspecified lump in the left breast, unspecified quadrant: Secondary | ICD-10-CM

## 2018-01-01 DIAGNOSIS — N898 Other specified noninflammatory disorders of vagina: Secondary | ICD-10-CM | POA: Diagnosis not present

## 2018-01-01 DIAGNOSIS — Z30431 Encounter for routine checking of intrauterine contraceptive device: Secondary | ICD-10-CM | POA: Diagnosis not present

## 2018-01-03 ENCOUNTER — Other Ambulatory Visit: Payer: Medicaid Other

## 2018-01-15 ENCOUNTER — Other Ambulatory Visit: Payer: Medicaid Other

## 2018-01-23 ENCOUNTER — Other Ambulatory Visit: Payer: Medicaid Other

## 2018-02-09 ENCOUNTER — Ambulatory Visit
Admission: RE | Admit: 2018-02-09 | Discharge: 2018-02-09 | Disposition: A | Discharge status: Home / Self Care | Payer: Medicaid Other | Source: Ambulatory Visit | Attending: Obstetrics & Gynecology | Admitting: Obstetrics & Gynecology

## 2018-02-09 DIAGNOSIS — N6324 Unspecified lump in the left breast, lower inner quadrant: Secondary | ICD-10-CM | POA: Insufficient documentation

## 2018-02-09 DIAGNOSIS — N6320 Unspecified lump in the left breast, unspecified quadrant: Secondary | ICD-10-CM

## 2018-02-09 DIAGNOSIS — R928 Other abnormal and inconclusive findings on diagnostic imaging of breast: Secondary | ICD-10-CM | POA: Diagnosis not present

## 2018-02-09 DIAGNOSIS — N6002 Solitary cyst of left breast: Secondary | ICD-10-CM | POA: Diagnosis not present

## 2018-05-30 DIAGNOSIS — R300 Dysuria: Secondary | ICD-10-CM | POA: Diagnosis not present

## 2018-08-20 DIAGNOSIS — E282 Polycystic ovarian syndrome: Secondary | ICD-10-CM | POA: Diagnosis not present

## 2018-08-20 DIAGNOSIS — Z0000 Encounter for general adult medical examination without abnormal findings: Secondary | ICD-10-CM | POA: Diagnosis not present

## 2018-08-20 DIAGNOSIS — G4733 Obstructive sleep apnea (adult) (pediatric): Secondary | ICD-10-CM | POA: Diagnosis not present

## 2018-09-07 DIAGNOSIS — G4733 Obstructive sleep apnea (adult) (pediatric): Secondary | ICD-10-CM | POA: Diagnosis not present

## 2018-09-12 DIAGNOSIS — G4733 Obstructive sleep apnea (adult) (pediatric): Secondary | ICD-10-CM | POA: Diagnosis not present

## 2018-09-26 DIAGNOSIS — M545 Low back pain: Secondary | ICD-10-CM | POA: Diagnosis not present

## 2018-09-26 DIAGNOSIS — M25561 Pain in right knee: Secondary | ICD-10-CM | POA: Diagnosis not present

## 2018-09-26 DIAGNOSIS — M25511 Pain in right shoulder: Secondary | ICD-10-CM | POA: Diagnosis not present

## 2018-10-01 DIAGNOSIS — M25561 Pain in right knee: Secondary | ICD-10-CM | POA: Diagnosis not present

## 2018-10-01 DIAGNOSIS — G8929 Other chronic pain: Secondary | ICD-10-CM | POA: Diagnosis not present

## 2018-10-01 DIAGNOSIS — M545 Low back pain: Secondary | ICD-10-CM | POA: Diagnosis not present

## 2018-10-01 DIAGNOSIS — M25511 Pain in right shoulder: Secondary | ICD-10-CM | POA: Diagnosis not present

## 2018-10-01 DIAGNOSIS — M75101 Unspecified rotator cuff tear or rupture of right shoulder, not specified as traumatic: Secondary | ICD-10-CM | POA: Diagnosis not present

## 2018-10-12 DIAGNOSIS — G4733 Obstructive sleep apnea (adult) (pediatric): Secondary | ICD-10-CM | POA: Diagnosis not present

## 2018-11-12 DIAGNOSIS — G4733 Obstructive sleep apnea (adult) (pediatric): Secondary | ICD-10-CM | POA: Diagnosis not present

## 2018-12-12 DIAGNOSIS — G4733 Obstructive sleep apnea (adult) (pediatric): Secondary | ICD-10-CM | POA: Diagnosis not present

## 2019-01-12 DIAGNOSIS — G4733 Obstructive sleep apnea (adult) (pediatric): Secondary | ICD-10-CM | POA: Diagnosis not present

## 2019-01-14 DIAGNOSIS — Z20828 Contact with and (suspected) exposure to other viral communicable diseases: Secondary | ICD-10-CM | POA: Diagnosis not present

## 2019-02-12 DIAGNOSIS — G4733 Obstructive sleep apnea (adult) (pediatric): Secondary | ICD-10-CM | POA: Diagnosis not present

## 2019-02-17 IMAGING — US US BREAST*L* LIMITED INC AXILLA
1 series · 10 of 10 positions shown · non-contrast
Comparison: 10/31/2014.

ACR Breast Density Category a: The breast tissue is almost entirely
fatty.

CLINICAL DATA: Mass felt in the left lateral chest wall in the mid
axillary line for since June 2017. She also reports an enlarging
mass with increased thickening in the medial left breast, diagnosed
as a sebaceous cyst in 5568. History of breast cancer in a maternal
aunt.

EXAM:
DIGITAL DIAGNOSTIC BILATERAL MAMMOGRAM WITH CAD AND TOMO
ULTRASOUND LEFT BREAST

[Series 1: us breast*left* limited inc axilla · 0.06mm/px · 10 of 10 slices shown]
[im 1/10]
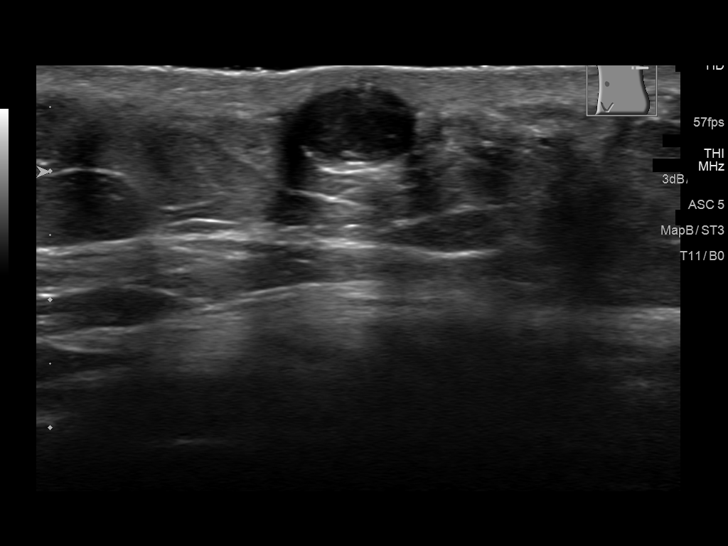
[im 2/10]
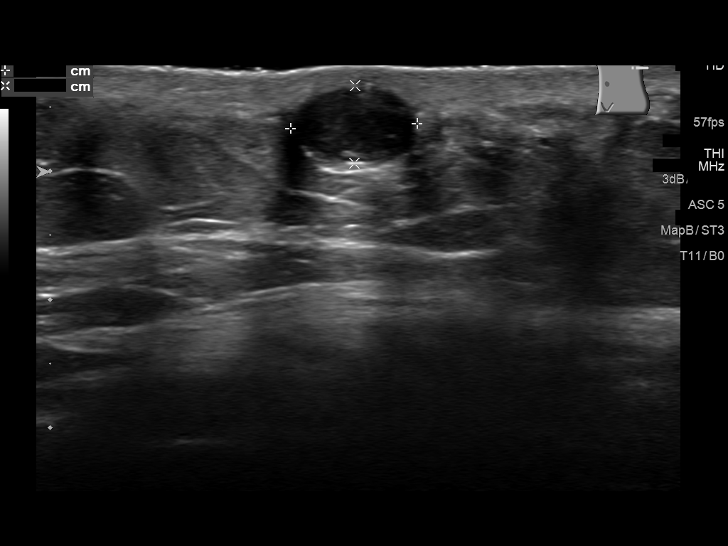
[im 3/10]
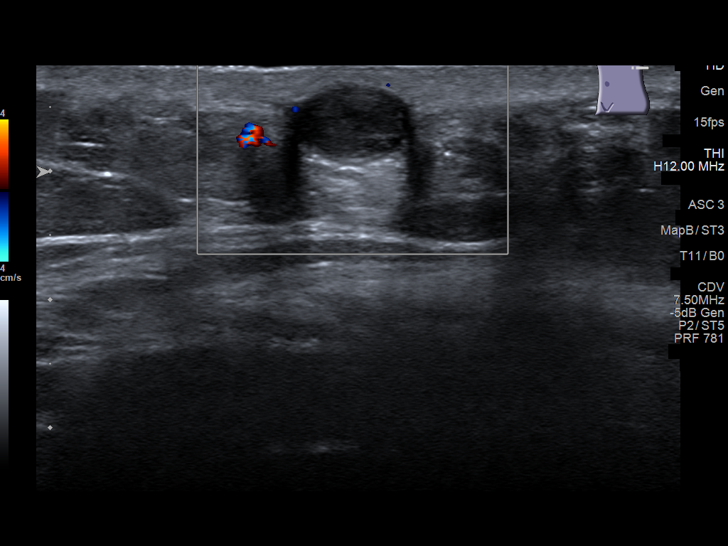
[im 4/10]
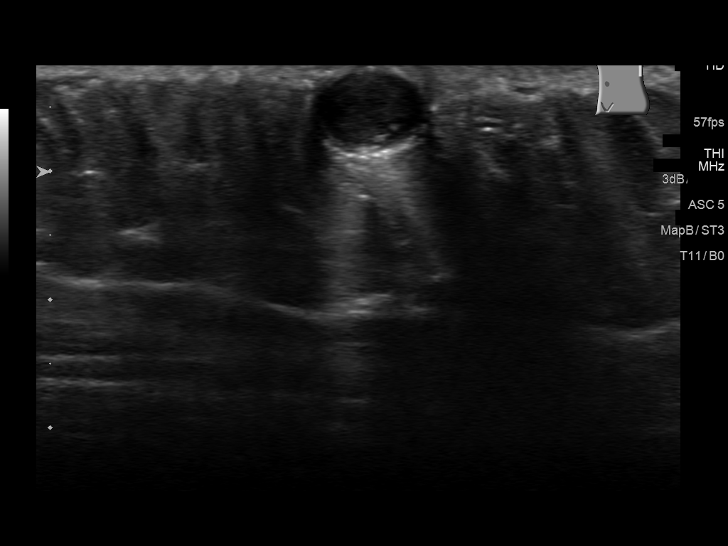
[im 5/10]
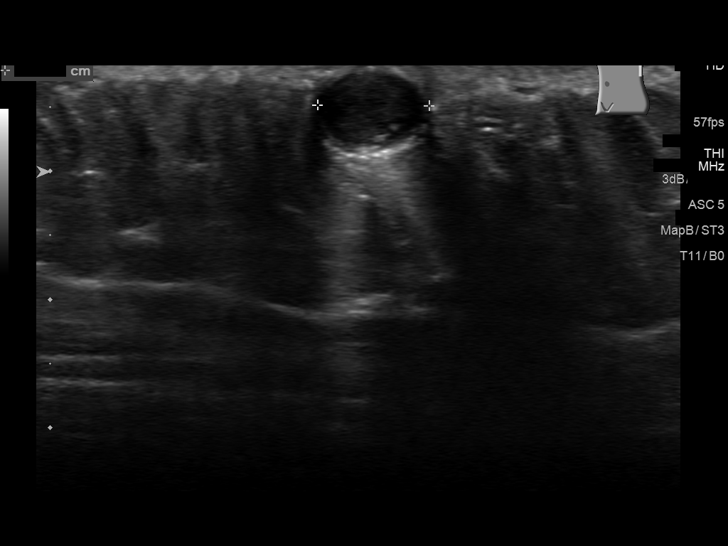
[im 6/10]
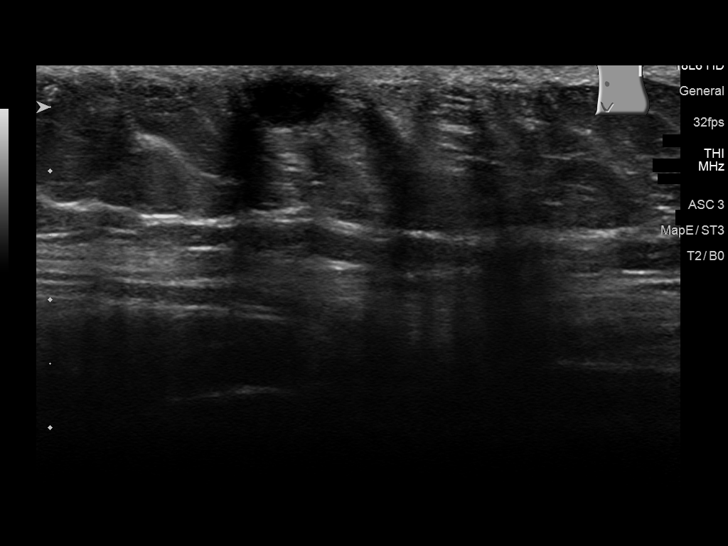
[im 7/10]
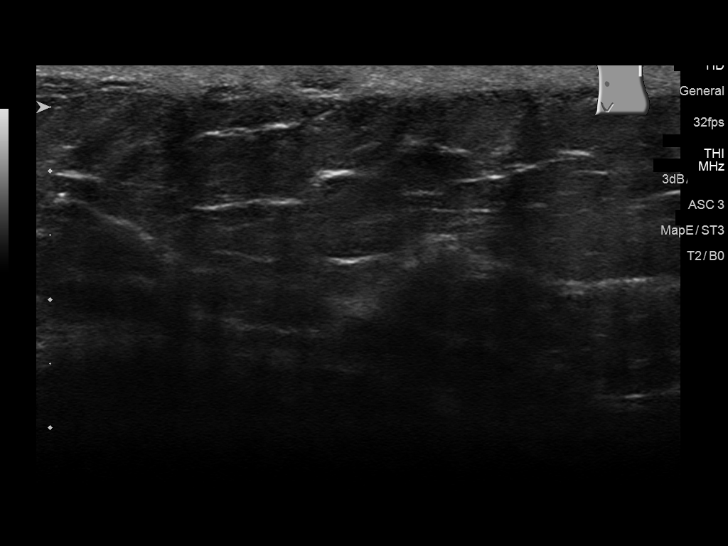
[im 8/10]
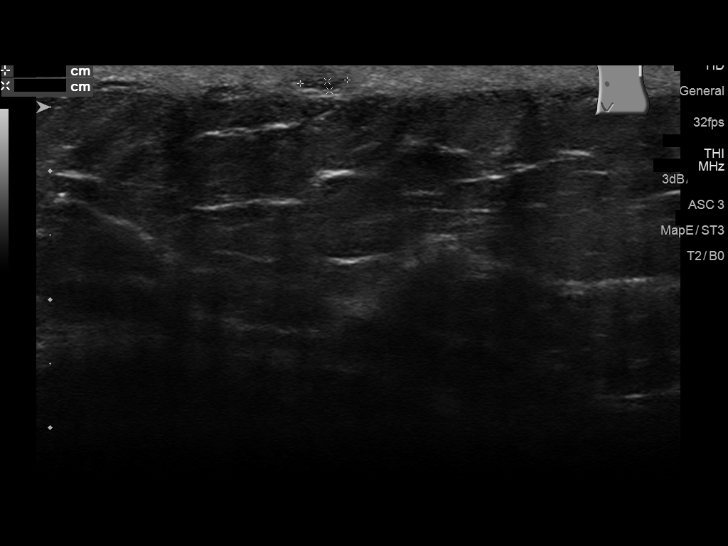
[im 9/10]
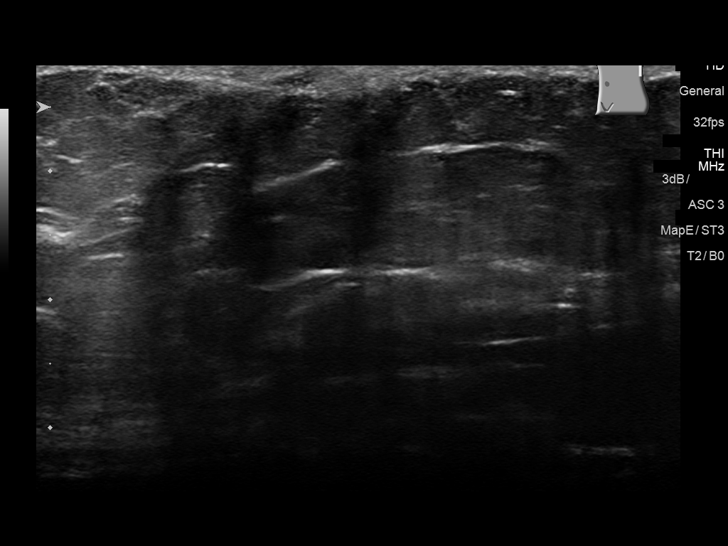
[im 10/10]
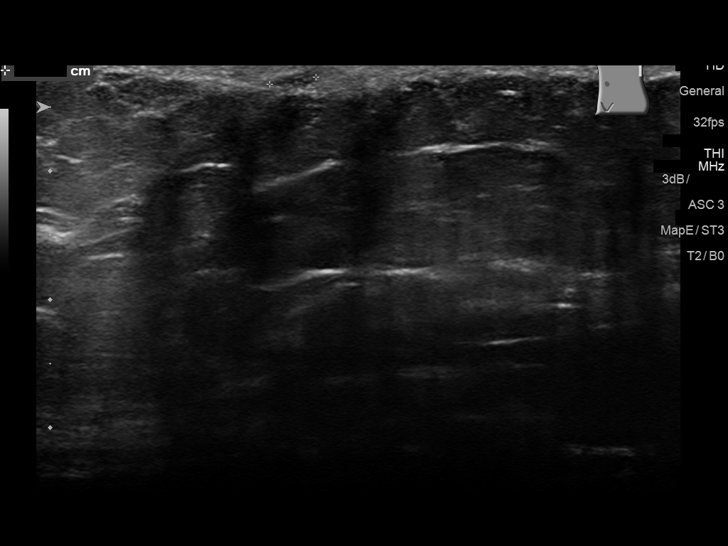

[10 of 10 positions shown; findings below may reference images not displayed]

FINDINGS: Stable mammographic appearance of the breasts with no interval
findings suspicious for malignancy.

Mammographic images were processed with CAD.

On physical exam, the patient has an approximately 1 cm oval
palpable mass in the skin of the left lateral chest wall with a
small dark brown colored punctum in the skin. There is a smaller
palpable mass in the skin at the junction of the breast with the
skin in the lower inner quadrant of the breast in the 8 o'clock
position with a small dark colored skin punctum. This corresponds to
the previously diagnosed sebaceous cyst.

Targeted ultrasound is performed, showing a 10 x 9 x 6 mm oval,
horizontally oriented, circumscribed, hypoechoic mass in the
posterior dermal later and extending into the underlying
subcutaneous fat at the left lateral chest wall, corresponding to
the palpable mass. This has a thin tract extending to the skin
superiorly.

In the 8 o'clock position of the left breast, 12 cm from the nipple,
there is a 4 x 4 x 1 mm oval, hypoechoic area within the posterior
aspect of an area of skin thickening, corresponding to the palpable
mass. This is slightly smaller than the area seen on 10/31/2014,
measuring 5 x 4 x 2 mm at that time.
IMPRESSION: 1. No evidence of malignancy.
2. Interval slight decrease in size of a small sebaceous cyst in the
8 o'clock position of the left breast at the junction of the breast
with the chest wall.
3. 10 mm sebaceous cyst in the skin of the left lateral chest wall
in the mid axillary line.

RECOMMENDATION:
Annual screening mammography beginning at age 40.

I have discussed the findings and recommendations with the patient.
Results were also provided in writing at the conclusion of the
visit. If applicable, a reminder letter will be sent to the patient
regarding the next appointment.

BI-RADS CATEGORY  2: Benign.

## 2019-03-14 DIAGNOSIS — G4733 Obstructive sleep apnea (adult) (pediatric): Secondary | ICD-10-CM | POA: Diagnosis not present

## 2019-04-10 DIAGNOSIS — G4733 Obstructive sleep apnea (adult) (pediatric): Secondary | ICD-10-CM | POA: Diagnosis not present

## 2019-04-14 DIAGNOSIS — G4733 Obstructive sleep apnea (adult) (pediatric): Secondary | ICD-10-CM | POA: Diagnosis not present

## 2019-05-08 DIAGNOSIS — R102 Pelvic and perineal pain: Secondary | ICD-10-CM | POA: Diagnosis not present

## 2019-05-13 DIAGNOSIS — R102 Pelvic and perineal pain: Secondary | ICD-10-CM | POA: Diagnosis not present

## 2019-05-14 DIAGNOSIS — G4733 Obstructive sleep apnea (adult) (pediatric): Secondary | ICD-10-CM | POA: Diagnosis not present

## 2019-07-22 DIAGNOSIS — Z113 Encounter for screening for infections with a predominantly sexual mode of transmission: Secondary | ICD-10-CM | POA: Diagnosis not present

## 2019-07-22 DIAGNOSIS — R102 Pelvic and perineal pain: Secondary | ICD-10-CM | POA: Diagnosis not present

## 2019-07-22 DIAGNOSIS — Z975 Presence of (intrauterine) contraceptive device: Secondary | ICD-10-CM | POA: Diagnosis not present

## 2019-10-05 DIAGNOSIS — H5213 Myopia, bilateral: Secondary | ICD-10-CM | POA: Diagnosis not present

## 2019-10-15 DIAGNOSIS — Z6841 Body Mass Index (BMI) 40.0 and over, adult: Secondary | ICD-10-CM | POA: Diagnosis not present

## 2019-10-15 DIAGNOSIS — D229 Melanocytic nevi, unspecified: Secondary | ICD-10-CM | POA: Diagnosis not present

## 2019-10-18 DIAGNOSIS — G4733 Obstructive sleep apnea (adult) (pediatric): Secondary | ICD-10-CM | POA: Diagnosis not present

## 2019-11-12 DIAGNOSIS — G4733 Obstructive sleep apnea (adult) (pediatric): Secondary | ICD-10-CM | POA: Diagnosis not present

## 2019-12-12 DIAGNOSIS — G4733 Obstructive sleep apnea (adult) (pediatric): Secondary | ICD-10-CM | POA: Diagnosis not present

## 2019-12-18 DIAGNOSIS — Z6841 Body Mass Index (BMI) 40.0 and over, adult: Secondary | ICD-10-CM | POA: Diagnosis not present

## 2019-12-18 DIAGNOSIS — E282 Polycystic ovarian syndrome: Secondary | ICD-10-CM | POA: Diagnosis not present

## 2019-12-18 DIAGNOSIS — R7303 Prediabetes: Secondary | ICD-10-CM | POA: Diagnosis not present

## 2019-12-18 DIAGNOSIS — F411 Generalized anxiety disorder: Secondary | ICD-10-CM | POA: Diagnosis not present

## 2020-02-10 DIAGNOSIS — Z975 Presence of (intrauterine) contraceptive device: Secondary | ICD-10-CM | POA: Diagnosis not present

## 2020-02-10 DIAGNOSIS — N921 Excessive and frequent menstruation with irregular cycle: Secondary | ICD-10-CM | POA: Diagnosis not present

## 2020-03-17 DIAGNOSIS — Z20822 Contact with and (suspected) exposure to covid-19: Secondary | ICD-10-CM | POA: Diagnosis not present

## 2020-04-01 DIAGNOSIS — Z20822 Contact with and (suspected) exposure to covid-19: Secondary | ICD-10-CM | POA: Diagnosis not present

## 2020-04-14 DIAGNOSIS — E669 Obesity, unspecified: Secondary | ICD-10-CM | POA: Diagnosis not present

## 2020-04-14 DIAGNOSIS — Z6841 Body Mass Index (BMI) 40.0 and over, adult: Secondary | ICD-10-CM | POA: Diagnosis not present

## 2020-04-14 DIAGNOSIS — F411 Generalized anxiety disorder: Secondary | ICD-10-CM | POA: Diagnosis not present

## 2020-04-14 DIAGNOSIS — R7303 Prediabetes: Secondary | ICD-10-CM | POA: Diagnosis not present

## 2020-04-21 DIAGNOSIS — R7303 Prediabetes: Secondary | ICD-10-CM | POA: Diagnosis not present

## 2020-04-21 DIAGNOSIS — F411 Generalized anxiety disorder: Secondary | ICD-10-CM | POA: Diagnosis not present

## 2020-04-21 DIAGNOSIS — Z6841 Body Mass Index (BMI) 40.0 and over, adult: Secondary | ICD-10-CM | POA: Diagnosis not present

## 2020-04-21 DIAGNOSIS — E282 Polycystic ovarian syndrome: Secondary | ICD-10-CM | POA: Diagnosis not present

## 2020-06-03 DIAGNOSIS — Z114 Encounter for screening for human immunodeficiency virus [HIV]: Secondary | ICD-10-CM | POA: Diagnosis not present

## 2020-06-03 DIAGNOSIS — Z113 Encounter for screening for infections with a predominantly sexual mode of transmission: Secondary | ICD-10-CM | POA: Diagnosis not present

## 2020-06-19 DIAGNOSIS — J40 Bronchitis, not specified as acute or chronic: Secondary | ICD-10-CM | POA: Diagnosis not present

## 2020-06-19 DIAGNOSIS — J4521 Mild intermittent asthma with (acute) exacerbation: Secondary | ICD-10-CM | POA: Diagnosis not present

## 2020-06-19 DIAGNOSIS — R6889 Other general symptoms and signs: Secondary | ICD-10-CM | POA: Diagnosis not present

## 2020-06-19 DIAGNOSIS — Z20828 Contact with and (suspected) exposure to other viral communicable diseases: Secondary | ICD-10-CM | POA: Diagnosis not present

## 2020-07-16 DIAGNOSIS — Z0000 Encounter for general adult medical examination without abnormal findings: Secondary | ICD-10-CM | POA: Diagnosis not present

## 2020-08-20 DIAGNOSIS — E282 Polycystic ovarian syndrome: Secondary | ICD-10-CM | POA: Diagnosis not present

## 2020-08-20 DIAGNOSIS — Z0000 Encounter for general adult medical examination without abnormal findings: Secondary | ICD-10-CM | POA: Diagnosis not present

## 2020-08-20 DIAGNOSIS — R7303 Prediabetes: Secondary | ICD-10-CM | POA: Diagnosis not present

## 2020-08-20 DIAGNOSIS — Z6841 Body Mass Index (BMI) 40.0 and over, adult: Secondary | ICD-10-CM | POA: Diagnosis not present

## 2020-08-26 DIAGNOSIS — F418 Other specified anxiety disorders: Secondary | ICD-10-CM | POA: Diagnosis not present

## 2020-09-07 DIAGNOSIS — F418 Other specified anxiety disorders: Secondary | ICD-10-CM | POA: Diagnosis not present

## 2020-09-21 DIAGNOSIS — F418 Other specified anxiety disorders: Secondary | ICD-10-CM | POA: Diagnosis not present

## 2020-09-24 DIAGNOSIS — G4733 Obstructive sleep apnea (adult) (pediatric): Secondary | ICD-10-CM | POA: Diagnosis not present

## 2020-11-12 DIAGNOSIS — R5383 Other fatigue: Secondary | ICD-10-CM | POA: Diagnosis not present

## 2020-11-12 DIAGNOSIS — L508 Other urticaria: Secondary | ICD-10-CM | POA: Diagnosis not present

## 2020-11-12 DIAGNOSIS — Z03818 Encounter for observation for suspected exposure to other biological agents ruled out: Secondary | ICD-10-CM | POA: Diagnosis not present

## 2020-12-31 DIAGNOSIS — J3081 Allergic rhinitis due to animal (cat) (dog) hair and dander: Secondary | ICD-10-CM | POA: Diagnosis not present

## 2020-12-31 DIAGNOSIS — L501 Idiopathic urticaria: Secondary | ICD-10-CM | POA: Diagnosis not present

## 2020-12-31 DIAGNOSIS — J3089 Other allergic rhinitis: Secondary | ICD-10-CM | POA: Diagnosis not present

## 2020-12-31 DIAGNOSIS — J309 Allergic rhinitis, unspecified: Secondary | ICD-10-CM | POA: Diagnosis not present

## 2021-03-24 DIAGNOSIS — G4733 Obstructive sleep apnea (adult) (pediatric): Secondary | ICD-10-CM | POA: Diagnosis not present

## 2021-07-08 DIAGNOSIS — Z113 Encounter for screening for infections with a predominantly sexual mode of transmission: Secondary | ICD-10-CM | POA: Diagnosis not present

## 2021-07-20 DIAGNOSIS — J020 Streptococcal pharyngitis: Secondary | ICD-10-CM | POA: Diagnosis not present

## 2021-07-23 DIAGNOSIS — M545 Low back pain, unspecified: Secondary | ICD-10-CM | POA: Diagnosis not present

## 2021-09-01 DIAGNOSIS — K769 Liver disease, unspecified: Secondary | ICD-10-CM | POA: Diagnosis not present

## 2021-09-02 DIAGNOSIS — M2142 Flat foot [pes planus] (acquired), left foot: Secondary | ICD-10-CM | POA: Diagnosis not present

## 2021-09-02 DIAGNOSIS — M79672 Pain in left foot: Secondary | ICD-10-CM | POA: Diagnosis not present

## 2021-09-14 DIAGNOSIS — J988 Other specified respiratory disorders: Secondary | ICD-10-CM | POA: Diagnosis not present

## 2021-09-14 DIAGNOSIS — R051 Acute cough: Secondary | ICD-10-CM | POA: Diagnosis not present

## 2021-09-14 DIAGNOSIS — J029 Acute pharyngitis, unspecified: Secondary | ICD-10-CM | POA: Diagnosis not present

## 2023-08-07 ENCOUNTER — Ambulatory Visit (INDEPENDENT_AMBULATORY_CARE_PROVIDER_SITE_OTHER): Payer: BC Managed Care – PPO | Admitting: Internal Medicine

## 2023-08-07 VITALS — BP 131/97 | HR 67 | Resp 16 | Ht 70.0 in | Wt 348.0 lb

## 2023-08-07 DIAGNOSIS — Z7189 Other specified counseling: Secondary | ICD-10-CM | POA: Insufficient documentation

## 2023-08-07 DIAGNOSIS — G4733 Obstructive sleep apnea (adult) (pediatric): Secondary | ICD-10-CM | POA: Diagnosis not present

## 2023-08-07 DIAGNOSIS — J309 Allergic rhinitis, unspecified: Secondary | ICD-10-CM | POA: Insufficient documentation

## 2023-08-07 NOTE — Patient Instructions (Signed)

## 2023-08-07 NOTE — Progress Notes (Signed)
 Sleep Medicine   Office Visit  Patient Name: Kerry Donovan DOB: 12/19/1980 MRN [id number]    Chief Complaint: Sleep Evaluation  Brief History:  Kerry Donovan presents for an initial consult for sleep evaluation and to establish care. The patient has a 6 month history of sleep apnea and is currently on a CPAP. Prior to using a PAP, sleep quality was poor. This was noted every night. The patient reported the following symptoms:  snoring, headaches, some trouble concentrating, brain fog and fatigue . The patient goes to sleep at 1100 pm and wakes up at 0630 am. The patient history of psychiatric problems. The Epworth Sleepiness Score is 12 out of 24 . The patient relates  Cardiovascular risk factors include: none. . The patient is currently on a CPAP@ 10 cmH2O. The patient reports not using Kerry Donovan PAP and feels somewhat rested after sleeping with PAP.  The patient reports benefiting from PAP use and would like for Kerry Donovan to continue using PAP. Reported sleepiness is improved.  The compliance download shows  3% compliance with an average use time of 1 hours 26  minutes. The AHI is 9.4. The patient continues to require PAP therapy as a medical necessity in order to eliminate Kerry Donovan sleep apnea.    ROS  General: (-) fever, (-) chills, (-) night sweat Nose and Sinuses: (+) nasal stuffiness or itchiness, (- postnasal drip, (-) nosebleeds, (-) sinus trouble. Mouth and Throat: (-) sore throat, (-) hoarseness. Neck: (-) swollen glands, (-) enlarged thyroid, (-) neck pain. Respiratory: - cough, - shortness of breath, - wheezing. Neurologic: - numbness, - tingling. Psychiatric: - anxiety, - depression Sleep behavior: -sleep paralysis -hypnogogic hallucinations -dream enactment      -vivid dreams -cataplexy -night terrors -sleep walking   Current Medication: Outpatient Encounter Medications as of 08/07/2023  Medication Sig   dicyclomine (BENTYL) 10 MG capsule Take 10 mg by mouth 4 (four) times daily -  before  meals and at bedtime.   fluconazole (DIFLUCAN) 150 MG tablet Take 1 tablet (150 mg total) by mouth daily. (Patient not taking: Reported on 11/22/2017)   medroxyPROGESTERone (PROVERA) 10 MG tablet    Multiple Vitamin (MULTIVITAMIN) tablet Take 1 tablet by mouth daily.   sulfamethoxazole-trimethoprim (BACTRIM DS,SEPTRA DS) 800-160 MG tablet Take 1 tablet by mouth 2 (two) times daily. (Patient not taking: Reported on 11/22/2017)   No facility-administered encounter medications on file as of 08/07/2023.    Surgical History: Past Surgical History:  Procedure Laterality Date   COLONOSCOPY WITH PROPOFOL N/A 11/22/2017   Procedure: COLONOSCOPY WITH PROPOFOL;  Surgeon: Toledo, Boykin Nearing, MD;  Location: [facility] END[facility]COPY;  Service: Gastroenterology;  Laterality: N/A;   ESOPHAGOGASTRODUODENOSCOPY (EGD) WITH PROPOFOL N/A 11/22/2017   Procedure: ESOPHAGOGASTRODUODENOSCOPY (EGD) WITH PROPOFOL;  Surgeon: Toledo, Boykin Nearing, MD;  Location: [facility] ENDOSCOPY;  Service: Gastroenterology;  Laterality: N/A;   NO PAST SURGERIES     WISDOM TOOTH EXTRACTION      Medical History: Past Medical History:  Diagnosis Date   Asthma    PCOS (polycystic ovarian syndrome)    Sleep apnea     Family History: Non contributory to the present illness  Social History: Social History   Socioeconomic History   Marital status: Single    Spouse name: Not on file   Number of children: Not on file   Years of education: Not on file   Highest education level: Not on file  Occupational History   Not on file  Tobacco Use   Smoking status: Never  Smokeless tobacco: Never  Vaping Use   Vaping status: Never Used  Substance and Sexual Activity   Alcohol use: No   Drug use: No   Sexual activity: Not on file  Other Topics Concern   Not on file  Social History Narrative   Not on file   Social Drivers of Health   Financial Resource Strain: Not on file  Food Insecurity: Not on file  Transportation Needs: Not on file   Physical Activity: Not on file  Stress: Not on file  Social Connections: Not on file  Intimate Partner Violence: Not on file    Vital Signs: There were no vitals taken for this visit. There is no height or weight on file to calculate BMI.   Examination: General Appearance: The patient is well-developed, well-nourished, and in no distress. Neck Circumference: 41 cm Skin: Gross inspection of skin unremarkable. Head: normocephalic, no gross deformities. Eyes: no gross deformities noted. ENT: ears appear grossly normal Neurologic: Alert and oriented. No involuntary movements.    STOP BANG RISK ASSESSMENT S (snore) Have you been told that you snore?     YES   T (tired) Are you often tired, fatigued, or sleepy during the day?   NO  O (obstruction) Do you stop breathing, choke, or gasp during sleep? NO   P (pressure) Do you have or are you being treated for high blood pressure? NO   B (BMI) Is your body index greater than 35 kg/m? YES   A (age) Are you 47 years old or older? NO   N (neck) Do you have a neck circumference greater than 16 inches?   NO   G (gender) Are you a female? NO   TOTAL STOP/BANG "YES" ANSWERS 2                                                               A STOP-Bang score of 2 or less is considered low risk, and a score of 5 or more is high risk for having either moderate or severe OSA. For people who score 3 or 4, doctors may need to perform further assessment to determine how likely they are to have OSA.         EPWORTH SLEEPINESS SCALE:  Scale:  (0)= no chance of dozing; (1)= slight chance of dozing; (2)= moderate chance of dozing; (3)= high chance of dozing  Chance  Situtation    Sitting and reading: 3    Watching TV: 1    Sitting Inactive in public: 1    As a passenger in car: 3      Lying down to rest: 2    Sitting and talking: 1    Sitting quielty after lunch: 1    In a car, stopped in traffic: 0   TOTAL SCORE:   12 out of  24    SLEEP STUDIES:  PSG (01/2023) AHI 22/hr, REM AHI 63/hr, min SpO2 84% Titration (02/2023) CPAP@ 10 cmH2O  CPAP COMPLIANCE DATA:  Date Range: 04/12/2023-08/05/2023  Average Daily Use: 1 hours 26 minutes  Median Use: 32 minutes  Compliance for > 4 Hours: 3 %  AHI: 9.4 respiratory events per hour  Days Used: 25/118 days  Mask Leak: 91.1  95th Percentile Pressure: 10  LABS: No results found for this or any previous visit (from the past 2160 hours).  Radiology: MM DIAG BREAST TOMO BILATERAL Result Date: 02/09/2018 CLINICAL DATA:  Mass felt in the left lateral chest wall in the mid axillary line for since January 2019. She also reports an enlarging mass with increased thickening in the medial left breast, diagnosed as a sebaceous cyst in 2016. History of breast cancer in a maternal aunt. EXAM: DIGITAL DIAGNOSTIC BILATERAL MAMMOGRAM WITH CAD AND TOMO ULTRASOUND LEFT BREAST COMPARISON:  10/31/2014. ACR Breast Density Category a: The breast tissue is almost entirely fatty. FINDINGS: Stable mammographic appearance of the breasts with no interval findings suspicious for malignancy. Mammographic images were processed with CAD. On physical exam, the patient has an approximately 1 cm oval palpable mass in the skin of the left lateral chest wall with a small dark brown colored punctum in the skin. There is a smaller palpable mass in the skin at the junction of the breast with the skin in the lower inner quadrant of the breast in the 8 o'clock position with a small dark colored skin punctum. This corresponds to the previously diagnosed sebaceous cyst. Targeted ultrasound is performed, showing a 10 x 9 x 6 mm oval, horizontally oriented, circumscribed, hypoechoic mass in the posterior dermal later and extending into the underlying subcutaneous fat at the left lateral chest wall, corresponding to the palpable mass. This has a thin tract extending to the skin superiorly. In the 8 o'clock  position of the left breast, 12 cm from the nipple, there is a 4 x 4 x 1 mm oval, hypoechoic area within the posterior aspect of an area of skin thickening, corresponding to the palpable mass. This is slightly smaller than the area seen on 10/31/2014, measuring 5 x 4 x 2 mm at that time. IMPRESSION: 1. No evidence of malignancy. 2. Interval slight decrease in size of a small sebaceous cyst in the 8 o'clock position of the left breast at the junction of the breast with the chest wall. 3. 10 mm sebaceous cyst in the skin of the left lateral chest wall in the mid axillary line. RECOMMENDATION: Annual screening mammography beginning at age 102. I have discussed the findings and recommendations with the patient. Results were also provided in writing at the conclusion of the visit. If applicable, a reminder letter will be sent to the patient regarding the next appointment. BI-RADS CATEGORY  2: Benign. Electronically Signed   By: Beckie Salts M.D.   On: 02/09/2018 16:42   US BREAST LTD UNI LEFT INC AXILLA Result Date: 02/09/2018 CLINICAL DATA:  Mass felt in the left lateral chest wall in the mid axillary line for since January 2019. She also reports an enlarging mass with increased thickening in the medial left breast, diagnosed as a sebaceous cyst in 2016. History of breast cancer in a maternal aunt. EXAM: DIGITAL DIAGNOSTIC BILATERAL MAMMOGRAM WITH CAD AND TOMO ULTRASOUND LEFT BREAST COMPARISON:  10/31/2014. ACR Breast Density Category a: The breast tissue is almost entirely fatty. FINDINGS: Stable mammographic appearance of the breasts with no interval findings suspicious for malignancy. Mammographic images were processed with CAD. On physical exam, the patient has an approximately 1 cm oval palpable mass in the skin of the left lateral chest wall with a small dark brown colored punctum in the skin. There is a smaller palpable mass in the skin at the junction of the breast with the skin in the lower inner quadrant of  the breast in the  8 o'clock position with a small dark colored skin punctum. This corresponds to the previously diagnosed sebaceous cyst. Targeted ultrasound is performed, showing a 10 x 9 x 6 mm oval, horizontally oriented, circumscribed, hypoechoic mass in the posterior dermal later and extending into the underlying subcutaneous fat at the left lateral chest wall, corresponding to the palpable mass. This has a thin tract extending to the skin superiorly. In the 8 o'clock position of the left breast, 12 cm from the nipple, there is a 4 x 4 x 1 mm oval, hypoechoic area within the posterior aspect of an area of skin thickening, corresponding to the palpable mass. This is slightly smaller than the area seen on 10/31/2014, measuring 5 x 4 x 2 mm at that time. IMPRESSION: 1. No evidence of malignancy. 2. Interval slight decrease in size of a small sebaceous cyst in the 8 o'clock position of the left breast at the junction of the breast with the chest wall. 3. 10 mm sebaceous cyst in the skin of the left lateral chest wall in the mid axillary line. RECOMMENDATION: Annual screening mammography beginning at age 33. I have discussed the findings and recommendations with the patient. Results were also provided in writing at the conclusion of the visit. If applicable, a reminder letter will be sent to the patient regarding the next appointment. BI-RADS CATEGORY  2: Benign. Electronically Signed   By: Beckie Salts M.D.   On: 02/09/2018 16:42    No results found.  No results found.    Assessment and Plan: Patient Active Problem List   Diagnosis Date Noted   Intermittent asthma 11/22/2016   Morbid obesity with BMI of 45.0-49.9, adult (HCC) 10/24/2016   Polycystic ovaries 01/05/2012   Absence of menstruation 01/05/2012   Excessive hair on females 01/05/2012   Obesity, Class III, BMI 40-49.9 (morbid obesity) (HCC) 01/05/2012   1. OSA (obstructive sleep apnea) (Primary) Pt is struggling to tolerate cpap due to  headaches. She reports headache with any cpap use, and they have been complicated by a dental infection in recent weeks. She would like to resume cpap use but finds the mask uncomfortable. She will set up a mask fitting.  The headaches appear to be related to nasal congestion so she will try working on increasing the humidity, and treating the congestion with saline nasal mist.  Kerry Donovan apnea is not optimally controlled. Will change to apap 8-12. Will do 3 week download. She will practice mask acclimation strategies.   2. CPAP use counseling CPAP Counseling: had a lengthy discussion with the patient regarding the importance of PAP therapy in management of the sleep apnea. Patient appears to understand the risk factor reduction and also understands the risks associated with untreated sleep apnea. Patient will try to make a good faith effort to remain compliant with therapy. Also instructed the patient on proper cleaning of the device including the water must be changed daily if possible and use of distilled water is preferred. Patient understands that the machine should be regularly cleaned with appropriate recommended cleaning solutions that do not damage the PAP machine for example given white vinegar and water rinses. Other methods such as ozone treatment may not be as good as these simple methods to achieve cleaning.   3. Morbid obesity (HCC) Obesity Counseling: Had a lengthy discussion regarding patients BMI and weight issues. Patient was instructed on portion control as well as increased activity. Also discussed caloric restrictions with trying to maintain intake less than 2000 Kcal. Discussions  were made in accordance with the 5As of weight management. Simple actions such as not eating late and if able to, taking a walk is suggested.      General Counseling: I have discussed the findings of the evaluation and examination with Kerry Donovan.  I have also discussed any further diagnostic evaluation thatmay  be needed or ordered today. Kerry Donovan understanding of the findings of todays visit. We also reviewed Kerry Donovan medications today and discussed drug interactions and side effects including but not limited excessive drowsiness and altered mental states. We also discussed that there is always a risk not just to Kerry Donovan but also people around Kerry Donovan. she has been encouraged to call the office with any questions or concerns that should arise related to todays visit.  No orders of the defined types were placed in this encounter.       I have personally obtained a history, evaluated the patient, evaluated pertinent data, formulated the assessment and plan and placed orders.   This patient was seen today by Emmaline Kluver, PA-C in collaboration with Dr. Freda Munro.   Kerry Pax, MD [facility] Diplomate ABMS Pulmonary and Critical Care Medicine Sleep medicine

## 2023-09-01 ENCOUNTER — Other Ambulatory Visit: Payer: Self-pay | Admitting: Obstetrics and Gynecology

## 2023-09-01 DIAGNOSIS — Z1231 Encounter for screening mammogram for malignant neoplasm of breast: Secondary | ICD-10-CM

## 2023-10-06 NOTE — Progress Notes (Signed)
 [facility] [address] [place], [place] [address]  Pulmonary Sleep Medicine   Office Visit Note  Patient Name: Kerry Donovan DOB: 1980/10/30 MRN [id number]    Chief Complaint: Obstructive Sleep Apnea visit  Brief History:  Kerry Donovan is seen today for a follow up for APAP@ 8-12 cmH2O. The patient has a 8 month history of sleep apnea. Patient is not using PAP nightly.  The patient feels somewhat rested after sleeping with PAP.  The patient reports benefiting from PAP use. Reported sleepiness is  improved and the Epworth Sleepiness Score is 8 out of 24. The patient does not take naps. The patient complains of the following: pt is complaining of sinus congestion due to allergies and headaches especially after using PAP.  The compliance download shows 8% compliance with an average use time of 1 hour 13 minutes. The AHI is 10.7.  The patient does not complain of limb movements disrupting sleep. The patient continues to require PAP therapy in order to eliminate sleep apnea.   ROS  General: (-) fever, (-) chills, (-) night sweat Nose and Sinuses: (-) nasal stuffiness or itchiness, (-) postnasal drip, (-) nosebleeds, (-) sinus trouble. Mouth and Throat: (-) sore throat, (-) hoarseness. Neck: (-) swollen glands, (-) enlarged thyroid, (-) neck pain. Respiratory: - cough, - shortness of breath, - wheezing. Neurologic: - numbness, - tingling. Psychiatric: - anxiety, - depression   Current Medication: Outpatient Encounter Medications as of 10/09/2023  Medication Sig   Multiple Vitamin (MULTIVITAMIN) tablet Take 1 tablet by mouth daily.   No facility-administered encounter medications on file as of 10/09/2023.    Surgical History: Past Surgical History:  Procedure Laterality Date   COLONOSCOPY WITH PROPOFOL  N/A 11/22/2017   Procedure: COLONOSCOPY WITH PROPOFOL ;  Surgeon: Toledo, Alphonsus Jeans, MD;  Location: [facility] ENDOSCOPY;  Service: Gastroenterology;  Laterality: N/A;    ESOPHAGOGASTRODUODENOSCOPY (EGD) WITH PROPOFOL  N/A 11/22/2017   Procedure: ESOPHAGOGASTRODUODENOSCOPY (EGD) WITH PROPOFOL ;  Surgeon: Toledo, Alphonsus Jeans, MD;  Location: [facility] ENDOSCOPY;  Service: Gastroenterology;  Laterality: N/A;   NO PAST SURGERIES     WISDOM TOOTH EXTRACTION      Medical History: Past Medical History:  Diagnosis Date   Asthma    PCOS (polycystic ovarian syndrome)    Sleep apnea     Family History: Non contributory to the present illness  Social History: Social History   Socioeconomic History   Marital status: Single    Spouse name: Not on file   Number of children: Not on file   Years of education: Not on file   Highest education level: Not on file  Occupational History   Not on file  Tobacco Use   Smoking status: Never   Smokeless tobacco: Never  Vaping Use   Vaping status: Never Used  Substance and Sexual Activity   Alcohol use: No   Drug use: No   Sexual activity: Not on file  Other Topics Concern   Not on file  Social History Narrative   Not on file   Social Drivers of Health   Financial Resource Strain: Low Risk  (08/31/2023)   Received from [facility]   Overall Financial Resource Strain (CARDIA)    Difficulty of Paying Living Expenses: Not very hard  Food Insecurity: No Food Insecurity (08/31/2023)   Received from [facility]   Hunger Vital Sign    Worried About Running Out of Food in the Last Year: Never true    Ran Out of Food in  the Last Year: Never true  Transportation Needs: No Transportation Needs (08/31/2023)   Received from [facility] - Transportation    In the past 12 months, has lack of transportation kept you from medical appointments or from getting medications?: No    Lack of Transportation (Non-Medical): No  Physical Activity: Not on file  Stress: Not on file  Social Connections: Not on file  Intimate Partner Violence: Not on file    Vital Signs: Blood  pressure 115/89, pulse 88, resp. rate 16, height 5\' 10"  (1.778 m), weight (!) 350 lb (158.8 kg), SpO2 98%. Body mass index is 50.22 kg/m.    Examination: General Appearance: The patient is well-developed, well-nourished, and in no distress. Neck Circumference: 41 cm Skin: Gross inspection of skin unremarkable. Head: normocephalic, no gross deformities. Eyes: no gross deformities noted. ENT: ears appear grossly normal Neurologic: Alert and oriented. No involuntary movements.  STOP BANG RISK ASSESSMENT S (snore) Have you been told that you snore?     YES   T (tired) Are you often tired, fatigued, or sleepy during the day?   NO  O (obstruction) Do you stop breathing, choke, or gasp during sleep? NO   P (pressure) Do you have or are you being treated for high blood pressure? NO   B (BMI) Is your body index greater than 35 kg/m? YES   A (age) Are you 21 years old or older? NO   N (neck) Do you have a neck circumference greater than 16 inches?   NO   G (gender) Are you a female? NO   TOTAL STOP/BANG "YES" ANSWERS 2       A STOP-Bang score of 2 or less is considered low risk, and a score of 5 or more is high risk for having either moderate or severe OSA. For people who score 3 or 4, doctors may need to perform further assessment to determine how likely they are to have OSA.         EPWORTH SLEEPINESS SCALE:  Scale:  (0)= no chance of dozing; (1)= slight chance of dozing; (2)= moderate chance of dozing; (3)= high chance of dozing  Chance  Situtation    Sitting and reading: 3    Watching TV: 1    Sitting Inactive in public: 0    As a passenger in car: 1      Lying down to rest: 1    Sitting and talking: 1    Sitting quielty after lunch: 1    In a car, stopped in traffic: 0   TOTAL SCORE:   8 out of 24    SLEEP STUDIES:  PSG (01/2023) AHI 22/hr, REM AHI 63/hr, min SpO2 84% Titration (02/2023) CPAP@ 10 cmH2O   CPAP COMPLIANCE DATA:  Date Range:  08/06/2022-10/05/2023  Average Daily Use: 1 hour 13 minutes  Median Use: 35 minutes  Compliance for > 4 Hours: 8%  AHI: 10.7 respiratory events per hour  Days Used: 48/60 days  Mask Leak: 102  95th Percentile Pressure: 9.7         LABS: No results found for this or any previous visit (from the past 2160 hours).  Radiology: MM DIAG BREAST TOMO BILATERAL Result Date: 02/09/2018 CLINICAL DATA:  Mass felt in the left lateral chest wall in the mid axillary line for since January 2019. She also reports an enlarging mass with increased thickening in the medial left breast, diagnosed as a sebaceous cyst in 2016. History  of breast cancer in a maternal aunt. EXAM: DIGITAL DIAGNOSTIC BILATERAL MAMMOGRAM WITH CAD AND TOMO ULTRASOUND LEFT BREAST COMPARISON:  10/31/2014. ACR Breast Density Category a: The breast tissue is almost entirely fatty. FINDINGS: Stable mammographic appearance of the breasts with no interval findings suspicious for malignancy. Mammographic images were processed with CAD. On physical exam, the patient has an approximately 1 cm oval palpable mass in the skin of the left lateral chest wall with a small dark brown colored punctum in the skin. There is a smaller palpable mass in the skin at the junction of the breast with the skin in the lower inner quadrant of the breast in the 8 o'clock position with a small dark colored skin punctum. This corresponds to the previously diagnosed sebaceous cyst. Targeted ultrasound is performed, showing a 10 x 9 x 6 mm oval, horizontally oriented, circumscribed, hypoechoic mass in the posterior dermal later and extending into the underlying subcutaneous fat at the left lateral chest wall, corresponding to the palpable mass. This has a thin tract extending to the skin superiorly. In the 8 o'clock position of the left breast, 12 cm from the nipple, there is a 4 x 4 x 1 mm oval, hypoechoic area within the posterior aspect of an area of skin thickening,  corresponding to the palpable mass. This is slightly smaller than the area seen on 10/31/2014, measuring 5 x 4 x 2 mm at that time. IMPRESSION: 1. No evidence of malignancy. 2. Interval slight decrease in size of a small sebaceous cyst in the 8 o'clock position of the left breast at the junction of the breast with the chest wall. 3. 10 mm sebaceous cyst in the skin of the left lateral chest wall in the mid axillary line. RECOMMENDATION: Annual screening mammography beginning at age 58. I have discussed the findings and recommendations with the patient. Results were also provided in writing at the conclusion of the visit. If applicable, a reminder letter will be sent to the patient regarding the next appointment. BI-RADS CATEGORY  2: Benign. Electronically Signed   By: Catherin Closs M.D.   On: 02/09/2018 16:42   US  BREAST LTD UNI LEFT INC AXILLA Result Date: 02/09/2018 CLINICAL DATA:  Mass felt in the left lateral chest wall in the mid axillary line for since January 2019. She also reports an enlarging mass with increased thickening in the medial left breast, diagnosed as a sebaceous cyst in 2016. History of breast cancer in a maternal aunt. EXAM: DIGITAL DIAGNOSTIC BILATERAL MAMMOGRAM WITH CAD AND TOMO ULTRASOUND LEFT BREAST COMPARISON:  10/31/2014. ACR Breast Density Category a: The breast tissue is almost entirely fatty. FINDINGS: Stable mammographic appearance of the breasts with no interval findings suspicious for malignancy. Mammographic images were processed with CAD. On physical exam, the patient has an approximately 1 cm oval palpable mass in the skin of the left lateral chest wall with a small dark brown colored punctum in the skin. There is a smaller palpable mass in the skin at the junction of the breast with the skin in the lower inner quadrant of the breast in the 8 o'clock position with a small dark colored skin punctum. This corresponds to the previously diagnosed sebaceous cyst. Targeted ultrasound  is performed, showing a 10 x 9 x 6 mm oval, horizontally oriented, circumscribed, hypoechoic mass in the posterior dermal later and extending into the underlying subcutaneous fat at the left lateral chest wall, corresponding to the palpable mass. This has a thin tract extending to the  skin superiorly. In the 8 o'clock position of the left breast, 12 cm from the nipple, there is a 4 x 4 x 1 mm oval, hypoechoic area within the posterior aspect of an area of skin thickening, corresponding to the palpable mass. This is slightly smaller than the area seen on 10/31/2014, measuring 5 x 4 x 2 mm at that time. IMPRESSION: 1. No evidence of malignancy. 2. Interval slight decrease in size of a small sebaceous cyst in the 8 o'clock position of the left breast at the junction of the breast with the chest wall. 3. 10 mm sebaceous cyst in the skin of the left lateral chest wall in the mid axillary line. RECOMMENDATION: Annual screening mammography beginning at age 39. I have discussed the findings and recommendations with the patient. Results were also provided in writing at the conclusion of the visit. If applicable, a reminder letter will be sent to the patient regarding the next appointment. BI-RADS CATEGORY  2: Benign. Electronically Signed   By: Catherin Closs M.D.   On: 02/09/2018 16:42    No results found.  No results found.    Assessment and Plan: Patient Active Problem List   Diagnosis Date Noted   OSA (obstructive sleep apnea) 08/07/2023   CPAP use counseling 08/07/2023   Allergic rhinitis 08/07/2023   Morbid obesity (HCC) 08/07/2023   Intermittent asthma 11/22/2016   Morbid obesity with BMI of 45.0-49.9, adult (HCC) 10/24/2016   Polycystic ovaries 01/05/2012   Absence of menstruation 01/05/2012   Excessive hair on females 01/05/2012   Obesity, Class III, BMI 40-49.9 (morbid obesity) (HCC) 01/05/2012    1. OSA (obstructive sleep apnea) (Primary) The patient is struggling to tolerate PAP and reports  minimal benefit from PAP use. She reports headaches. Her apnea is not optimally controlled. The patient was reminded how to clean equipment and advised to replace supplies routinely. The patient was also counselled on weight loss. The compliance is poor. The AHI is 10.7.   OSA on cpap- not controlled. Change to APAP 5-20. 2 week download. Overnight oximetry. F/u 23m  - Pulse oximetry, overnight; Future  2. CPAP use counseling CPAP Counseling: had a lengthy discussion with the patient regarding the importance of PAP therapy in management of the sleep apnea. Patient appears to understand the risk factor reduction and also understands the risks associated with untreated sleep apnea. Patient will try to make a good faith effort to remain compliant with therapy. Also instructed the patient on proper cleaning of the device including the water must be changed daily if possible and use of distilled water is preferred. Patient understands that the machine should be regularly cleaned with appropriate recommended cleaning solutions that do not damage the PAP machine for example given white vinegar and water rinses. Other methods such as ozone treatment may not be as good as these simple methods to achieve cleaning.   3. Morbid obesity (HCC) Obesity Counseling: Had a lengthy discussion regarding patients BMI and weight issues. Patient was instructed on portion control as well as increased activity. Also discussed caloric restrictions with trying to maintain intake less than 2000 Kcal. Discussions were made in accordance with the 5As of weight management. Simple actions such as not eating late and if able to, taking a walk is suggested.    General Counseling: I have discussed the findings of the evaluation and examination with Kerry Donovan.  I have also discussed any further diagnostic evaluation thatmay be needed or ordered today. Kerry Donovan verbalizes understanding of the  findings of todays visit. We also reviewed her  medications today and discussed drug interactions and side effects including but not limited excessive drowsiness and altered mental states. We also discussed that there is always a risk not just to her but also people around her. she has been encouraged to call the office with any questions or concerns that should arise related to todays visit.  Orders Placed This Encounter  Procedures   Pulse oximetry, overnight    Standing Status:   Future    Expiration Date:   10/08/2024        I have personally obtained a history, examined the patient, evaluated laboratory and imaging results, formulated the assessment and plan and placed orders. This patient was seen today by Kerry Rous, PA-C in collaboration with Dr. Cam Cava.   Cordie Deters, MD [facility] Diplomate ABMS Pulmonary Critical Care Medicine and Sleep Medicine

## 2023-10-09 ENCOUNTER — Ambulatory Visit (INDEPENDENT_AMBULATORY_CARE_PROVIDER_SITE_OTHER): Admitting: Internal Medicine

## 2023-10-09 VITALS — BP 115/89 | HR 88 | Resp 16 | Ht 70.0 in | Wt 350.0 lb

## 2023-10-09 DIAGNOSIS — G4733 Obstructive sleep apnea (adult) (pediatric): Secondary | ICD-10-CM | POA: Diagnosis not present

## 2023-10-09 DIAGNOSIS — Z7189 Other specified counseling: Secondary | ICD-10-CM

## 2023-10-09 NOTE — Patient Instructions (Signed)

## 2023-11-08 ENCOUNTER — Ambulatory Visit
Admission: RE | Admit: 2023-11-08 | Discharge: 2023-11-08 | Disposition: A | Discharge status: Home / Self Care | Source: Ambulatory Visit | Attending: Obstetrics and Gynecology | Admitting: Obstetrics and Gynecology

## 2023-11-08 DIAGNOSIS — Z1231 Encounter for screening mammogram for malignant neoplasm of breast: Secondary | ICD-10-CM | POA: Insufficient documentation

## 2023-11-14 ENCOUNTER — Other Ambulatory Visit: Payer: Self-pay

## 2023-11-14 ENCOUNTER — Emergency Department
Admission: EM | Admit: 2023-11-14 | Discharge: 2023-11-14 | Discharge status: Against Medical Advice | Attending: Emergency Medicine | Admitting: Emergency Medicine

## 2023-11-14 DIAGNOSIS — R197 Diarrhea, unspecified: Secondary | ICD-10-CM | POA: Diagnosis not present

## 2023-11-14 DIAGNOSIS — R634 Abnormal weight loss: Secondary | ICD-10-CM | POA: Insufficient documentation

## 2023-11-14 DIAGNOSIS — R112 Nausea with vomiting, unspecified: Secondary | ICD-10-CM | POA: Diagnosis not present

## 2023-11-14 DIAGNOSIS — Z5321 Procedure and treatment not carried out due to patient leaving prior to being seen by health care provider: Secondary | ICD-10-CM | POA: Diagnosis not present

## 2023-11-14 DIAGNOSIS — E860 Dehydration: Secondary | ICD-10-CM | POA: Insufficient documentation

## 2023-11-14 DIAGNOSIS — R109 Unspecified abdominal pain: Secondary | ICD-10-CM | POA: Diagnosis present

## 2023-11-14 DIAGNOSIS — R531 Weakness: Secondary | ICD-10-CM | POA: Diagnosis not present

## 2023-11-14 LAB — CBC
HCT: 39.1 % (ref 36.0–46.0)
Hemoglobin: 12.4 g/dL (ref 12.0–15.0)
MCH: 25.3 pg — ABNORMAL LOW (ref 26.0–34.0)
MCHC: 31.7 g/dL (ref 30.0–36.0)
MCV: 79.8 fL — ABNORMAL LOW (ref 80.0–100.0)
Platelets: 340 10*3/uL (ref 150–400)
RBC: 4.9 MIL/uL (ref 3.87–5.11)
RDW: 14.2 % (ref 11.5–15.5)
WBC: 6.6 10*3/uL (ref 4.0–10.5)
nRBC: 0 % (ref 0.0–0.2)

## 2023-11-14 LAB — LIPASE, BLOOD: Lipase: 26 U/L (ref 11–51)

## 2023-11-14 LAB — COMPREHENSIVE METABOLIC PANEL WITH GFR
ALT: 13 U/L (ref 0–44)
AST: 14 U/L — ABNORMAL LOW (ref 15–41)
Albumin: 3.5 g/dL (ref 3.5–5.0)
Alkaline Phosphatase: 61 U/L (ref 38–126)
Anion gap: 8 (ref 5–15)
BUN: 8 mg/dL (ref 6–20)
CO2: 25 mmol/L (ref 22–32)
Calcium: 9 mg/dL (ref 8.9–10.3)
Chloride: 104 mmol/L (ref 98–111)
Creatinine, Ser: 0.67 mg/dL (ref 0.44–1.00)
GFR, Estimated: >60 mL/min (ref >60)
Glucose, Bld: 104 mg/dL — ABNORMAL HIGH (ref 70–99)
Potassium: 3.7 mmol/L (ref 3.5–5.1)
Sodium: 137 mmol/L (ref 135–145)
Total Bilirubin: 0.6 mg/dL (ref 0.0–1.2)
Total Protein: 7.6 g/dL (ref 6.5–8.1)

## 2023-11-14 LAB — TROPONIN I (HIGH SENSITIVITY): Troponin I (High Sensitivity): 3 ng/L (ref <18)

## 2023-11-14 NOTE — ED Triage Notes (Signed)
 Pt to ED via [facility] c/o abd pain, N/V/D, and weakness since sunday. Pt reports she takes weight loss medication and had her 4th dose on Saturday. Started feeling sick on Sunday. Went to PCP yesterday for same, was told she was dehydrated and was told to drink fluids. Pt still feels weak. Denies CP, SOB, fevers, dizziness

## 2023-11-14 NOTE — ED Triage Notes (Signed)
 EMS brings pt in from home for c/o weakness x 2 days

## 2023-12-08 NOTE — Progress Notes (Signed)
 No show

## 2023-12-11 ENCOUNTER — Ambulatory Visit: Admitting: Internal Medicine
